# Patient Record
Sex: Male | Born: 1985
Health system: Southern US, Community
[De-identification: ages and names within clinical notes are randomized; demographics above are authoritative.]

## PROBLEM LIST (undated history)

## (undated) DIAGNOSIS — R4689 Other symptoms and signs involving appearance and behavior: Secondary | ICD-10-CM

## (undated) DIAGNOSIS — F913 Oppositional defiant disorder: Secondary | ICD-10-CM

## (undated) DIAGNOSIS — F32A Depression, unspecified: Secondary | ICD-10-CM

## (undated) DIAGNOSIS — K449 Diaphragmatic hernia without obstruction or gangrene: Secondary | ICD-10-CM

## (undated) DIAGNOSIS — F909 Attention-deficit hyperactivity disorder, unspecified type: Secondary | ICD-10-CM

## (undated) DIAGNOSIS — F329 Major depressive disorder, single episode, unspecified: Secondary | ICD-10-CM

## (undated) DIAGNOSIS — A630 Anogenital (venereal) warts: Secondary | ICD-10-CM

## (undated) DIAGNOSIS — F419 Anxiety disorder, unspecified: Secondary | ICD-10-CM

## (undated) HISTORY — DX: Other symptoms and signs involving appearance and behavior: R46.89

## (undated) HISTORY — DX: Major depressive disorder, single episode, unspecified: F32.9

## (undated) HISTORY — DX: Attention-deficit hyperactivity disorder, unspecified type: F90.9

## (undated) HISTORY — DX: Diaphragmatic hernia without obstruction or gangrene: K44.9

## (undated) HISTORY — DX: Oppositional defiant disorder: F91.3

## (undated) HISTORY — DX: Anogenital (venereal) warts: A63.0

## (undated) HISTORY — DX: Depression, unspecified: F32.A

## (undated) HISTORY — DX: Anxiety disorder, unspecified: F41.9

---

## 2004-01-26 ENCOUNTER — Emergency Department (HOSPITAL_COMMUNITY): Admission: EM | Admit: 2004-01-26 | Discharge: 2004-01-27 | Payer: Self-pay | Admitting: *Deleted

## 2006-05-18 ENCOUNTER — Emergency Department: Payer: Self-pay | Admitting: Emergency Medicine

## 2007-07-17 IMAGING — CR DG CHEST 2V
1 series · 2 of 2 positions shown · non-contrast
Comparison: none

REASON FOR EXAM: Pain. MC2
COMMENTS:

[Series 1: view not recorded · 0.17mm/px · 2 of 2 slices shown]
[im 1/2]
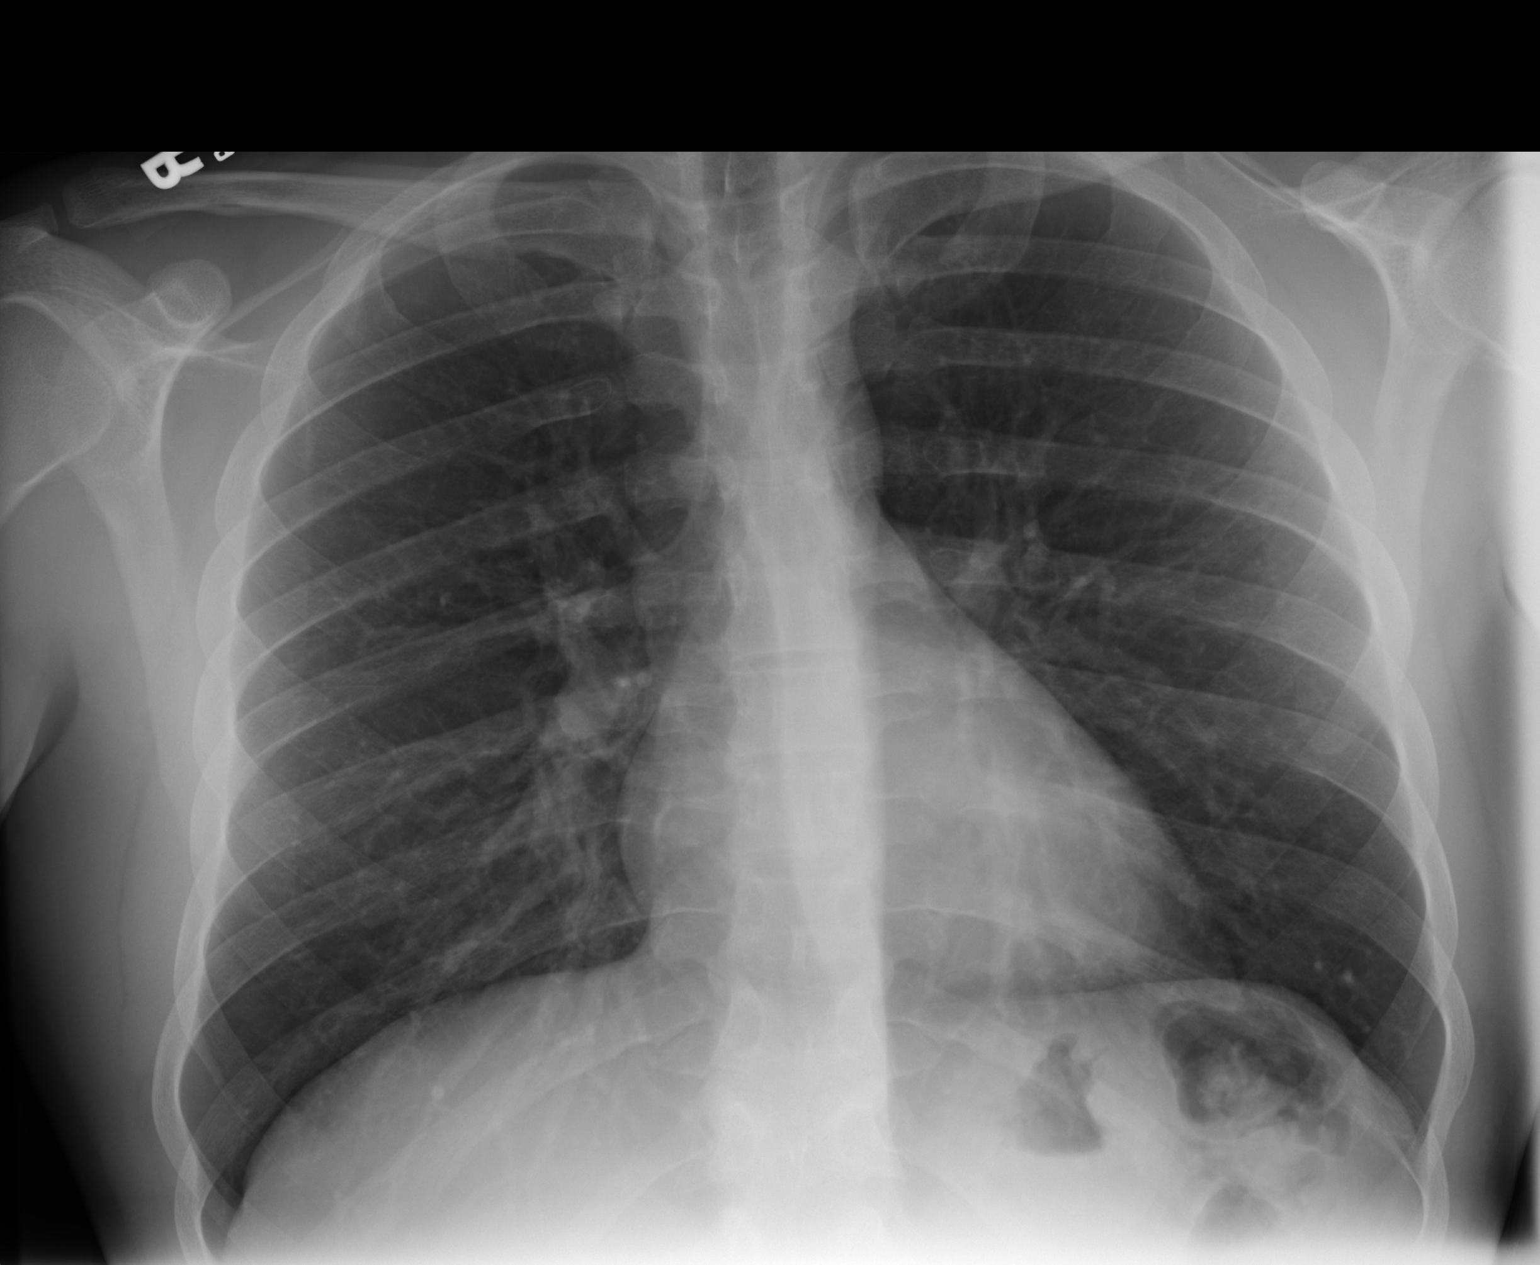
[im 2/2]
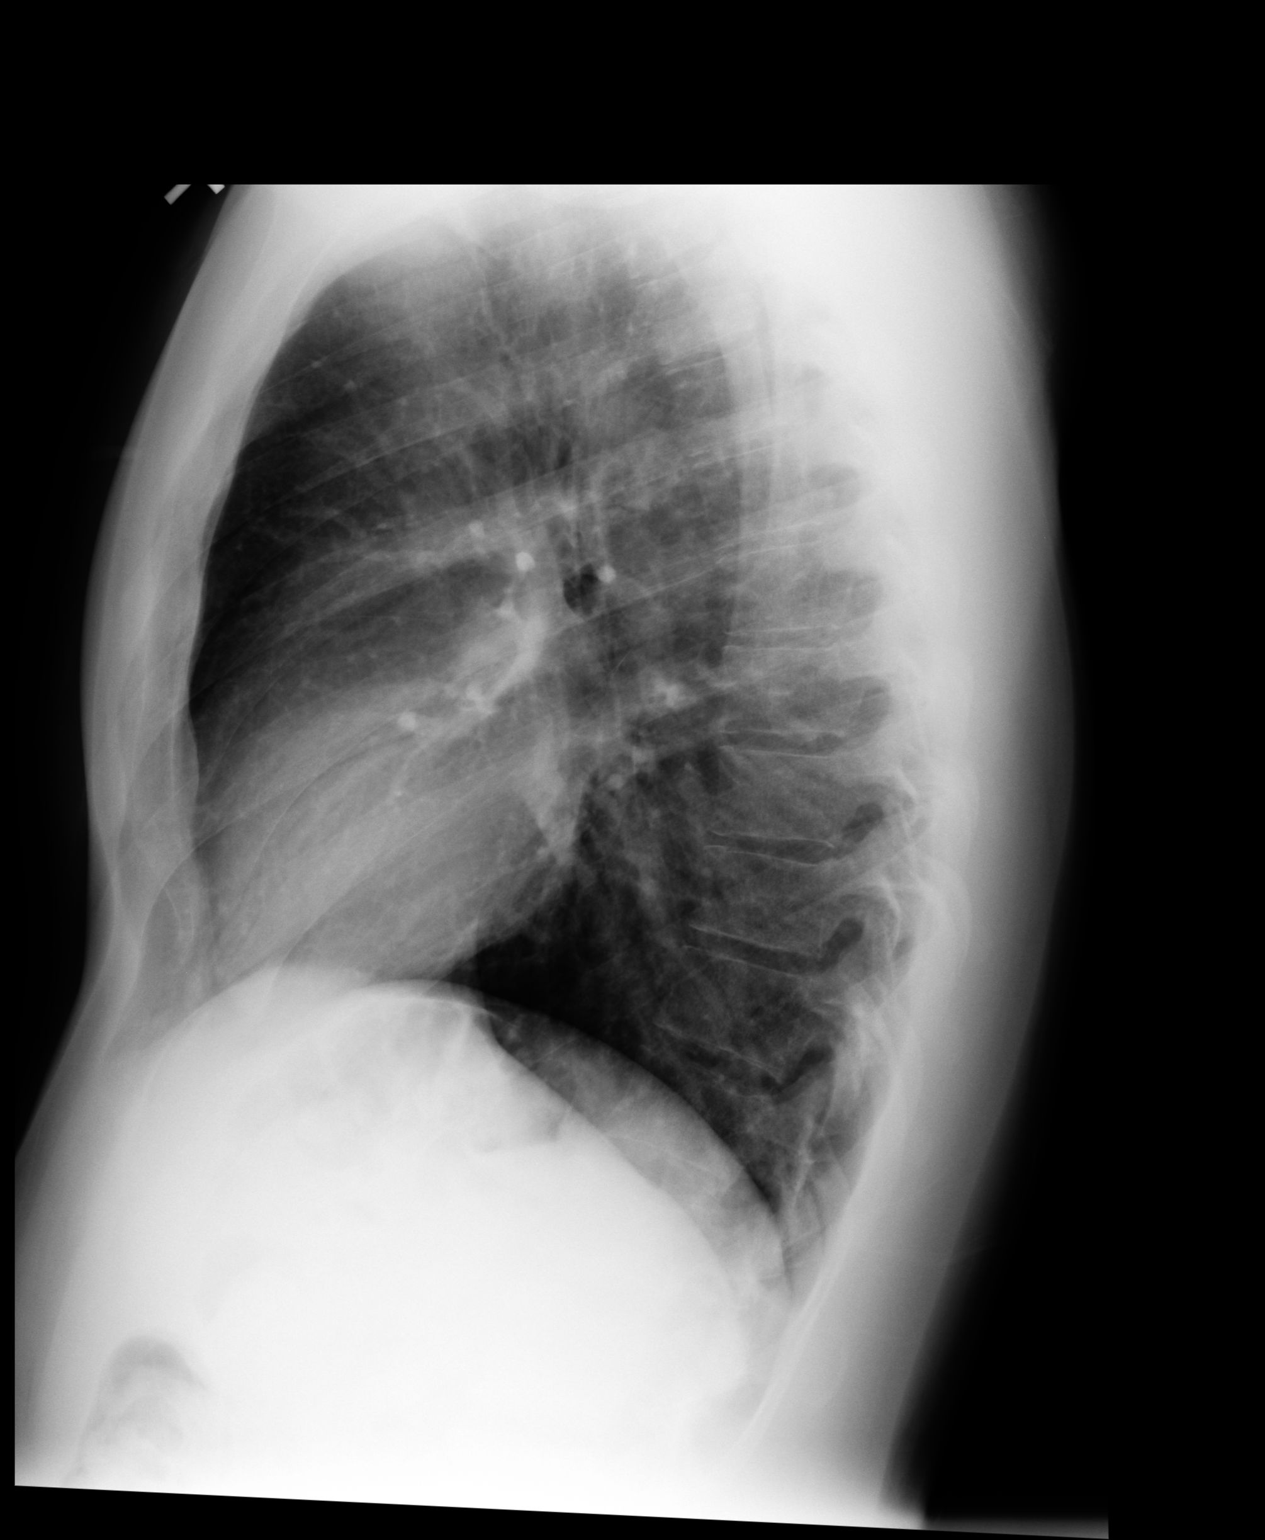

[2 of 2 positions shown; findings below may reference images not displayed]

PROCEDURE:     DXR - DXR CHEST PA (OR AP) AND LATERAL  - May 18, 2006  [DATE]

RESULT:     The lung fields are clear.  No pneumonia, pneumothorax, or
pleural effusion is seen.  The heart, mediastinal and osseous structures
show no significant abnormalities. The chest is hyperexpanded consistent
with a history of reactive airway disease.
IMPRESSION: The lung fields are clear.

The chest is hyperexpanded.

## 2009-05-02 HISTORY — PX: BRAIN SURGERY: SHX531

## 2009-09-27 ENCOUNTER — Emergency Department: Payer: Self-pay | Admitting: Emergency Medicine

## 2013-06-01 ENCOUNTER — Emergency Department: Payer: Self-pay | Admitting: Emergency Medicine

## 2013-06-06 ENCOUNTER — Emergency Department: Payer: Self-pay | Admitting: Emergency Medicine

## 2015-06-30 DIAGNOSIS — F909 Attention-deficit hyperactivity disorder, unspecified type: Secondary | ICD-10-CM | POA: Insufficient documentation

## 2015-06-30 DIAGNOSIS — Z8659 Personal history of other mental and behavioral disorders: Secondary | ICD-10-CM | POA: Insufficient documentation

## 2015-06-30 DIAGNOSIS — F32A Depression, unspecified: Secondary | ICD-10-CM | POA: Insufficient documentation

## 2015-06-30 DIAGNOSIS — R4689 Other symptoms and signs involving appearance and behavior: Secondary | ICD-10-CM | POA: Insufficient documentation

## 2015-06-30 DIAGNOSIS — F913 Oppositional defiant disorder: Secondary | ICD-10-CM

## 2015-06-30 DIAGNOSIS — F4321 Adjustment disorder with depressed mood: Secondary | ICD-10-CM | POA: Insufficient documentation

## 2015-06-30 DIAGNOSIS — A630 Anogenital (venereal) warts: Secondary | ICD-10-CM | POA: Insufficient documentation

## 2015-06-30 DIAGNOSIS — F329 Major depressive disorder, single episode, unspecified: Secondary | ICD-10-CM | POA: Insufficient documentation

## 2015-07-06 ENCOUNTER — Encounter: Payer: Self-pay | Admitting: Family Medicine

## 2015-07-06 ENCOUNTER — Ambulatory Visit (INDEPENDENT_AMBULATORY_CARE_PROVIDER_SITE_OTHER): Payer: 59 | Admitting: Family Medicine

## 2015-07-06 VITALS — BP 121/76 | HR 80 | Temp 98.7°F | Ht 71.25 in | Wt 180.0 lb

## 2015-07-06 DIAGNOSIS — D485 Neoplasm of uncertain behavior of skin: Secondary | ICD-10-CM | POA: Diagnosis not present

## 2015-07-06 DIAGNOSIS — R4689 Other symptoms and signs involving appearance and behavior: Secondary | ICD-10-CM

## 2015-07-06 DIAGNOSIS — Z720 Tobacco use: Secondary | ICD-10-CM | POA: Diagnosis not present

## 2015-07-06 DIAGNOSIS — N509 Disorder of male genital organs, unspecified: Secondary | ICD-10-CM | POA: Diagnosis not present

## 2015-07-06 DIAGNOSIS — Z0000 Encounter for general adult medical examination without abnormal findings: Secondary | ICD-10-CM | POA: Diagnosis not present

## 2015-07-06 DIAGNOSIS — F901 Attention-deficit hyperactivity disorder, predominantly hyperactive type: Secondary | ICD-10-CM

## 2015-07-06 DIAGNOSIS — F329 Major depressive disorder, single episode, unspecified: Secondary | ICD-10-CM | POA: Diagnosis not present

## 2015-07-06 DIAGNOSIS — B360 Pityriasis versicolor: Secondary | ICD-10-CM | POA: Diagnosis not present

## 2015-07-06 DIAGNOSIS — F913 Oppositional defiant disorder: Secondary | ICD-10-CM | POA: Diagnosis not present

## 2015-07-06 DIAGNOSIS — F32A Depression, unspecified: Secondary | ICD-10-CM

## 2015-07-06 NOTE — Patient Instructions (Addendum)
Use a selenium-containing shampoo twice a week; lather up, out of shower, dry for 15-20 minutes, then thoroughly rinse off; do that for a month We'll refer you to the dermatologist and psychiatrist Do self-testicular exams every month and let us know right away of any lumps or bumps Try tylenol 500 mg every six hours for a few days; if you continue to have discomfort in your genitals, then let me know and we'll refer you to a urologist We'll check labs today and contact you with the results If you have not heard anything from my staff in a week about any orders/referrals/studies from today, please contact us here to follow-up [phone] I'll really encourage you to cut down on your recreational use of marijuana, especially if it's causing you symptoms Don't ever drive if you feel like you might pass out or become weak or dizzy  Check your moles once a month and notify your (new) skin doctor if you have any that get larger or change color, especially if they turn darker or bleed I do encourage you to quit smoking Call [phone] to sign up for smoking cessation classes You can call 1-800-QUIT-NOW to talk with a smoking cessation coach   Health Maintenance, Male A healthy lifestyle and preventative care can promote health and wellness.  Maintain regular health, dental, and eye exams.  Eat a healthy diet. Foods like vegetables, fruits, whole grains, low-fat dairy products, and lean protein foods contain the nutrients you need and are low in calories. Decrease your intake of foods high in solid fats, added sugars, and salt. Get information about a proper diet from your health care provider, if necessary.  Regular physical exercise is one of the most important things you can do for your health. Most adults should get at least 150 minutes of moderate-intensity exercise (any activity that increases your heart rate and causes you to sweat) each week. In addition, most adults need  muscle-strengthening exercises on 2 or more days a week.   Maintain a healthy weight. The body mass index (BMI) is a screening tool to identify possible weight problems. It provides an estimate of body fat based on height and weight. Your health care provider can find your BMI and can help you achieve or maintain a healthy weight. For males 20 years and older:  A BMI below 18.5 is considered underweight.  A BMI of 18.5 to 24.9 is normal.  A BMI of 25 to 29.9 is considered overweight.  A BMI of 30 and above is considered obese.  Maintain normal blood lipids and cholesterol by exercising and minimizing your intake of saturated fat. Eat a balanced diet with plenty of fruits and vegetables. Blood tests for lipids and cholesterol should begin at age 10 and be repeated every 5 years. If your lipid or cholesterol levels are high, you are over age 48, or you are at high risk for heart disease, you may need your cholesterol levels checked more frequently.Ongoing high lipid and cholesterol levels should be treated with medicines if diet and exercise are not working.  If you smoke, find out from your health care provider how to quit. If you do not use tobacco, do not start.  Lung cancer screening is recommended for adults aged 15-80 years who are at high risk for developing lung cancer because of a history of smoking. A yearly low-dose CT scan of the lungs is recommended for people who have at least a 30-pack-year history of smoking and are current smokers  or have quit within the past 15 years. A pack year of smoking is smoking an average of 1 pack of cigarettes a day for 1 year (for example, a 30-pack-year history of smoking could mean smoking 1 pack a day for 30 years or 2 packs a day for 15 years). Yearly screening should continue until the smoker has stopped smoking for at least 15 years. Yearly screening should be stopped for people who develop a health problem that would prevent them from having lung  cancer treatment.  If you choose to drink alcohol, do not have more than 2 drinks per day. One drink is considered to be 12 oz (360 mL) of beer, 5 oz (150 mL) of wine, or 1.5 oz (45 mL) of liquor.  Avoid the use of street drugs. Do not share needles with anyone. Ask for help if you need support or instructions about stopping the use of drugs.  High blood pressure causes heart disease and increases the risk of stroke. High blood pressure is more likely to develop in:  People who have blood pressure in the end of the normal range (100-139/85-89 mm Hg).  People who are overweight or obese.  People who are African American.  If you are 45-5 years of age, have your blood pressure checked every 3-5 years. If you are 42 years of age or older, have your blood pressure checked every year. You should have your blood pressure measured twice--once when you are at a hospital or clinic, and once when you are not at a hospital or clinic. Record the average of the two measurements. To check your blood pressure when you are not at a hospital or clinic, you can use:  An automated blood pressure machine at a pharmacy.  A home blood pressure monitor.  If you are 49-25 years old, ask your health care provider if you should take aspirin to prevent heart disease.  Diabetes screening involves taking a blood sample to check your fasting blood sugar level. This should be done once every 3 years after age 91 if you are at a normal weight and without risk factors for diabetes. Testing should be considered at a younger age or be carried out more frequently if you are overweight and have at least 1 risk factor for diabetes.  Colorectal cancer can be detected and often prevented. Most routine colorectal cancer screening begins at the age of 82 and continues through age 19. However, your health care provider may recommend screening at an earlier age if you have risk factors for colon cancer. On a yearly basis, your health  care provider may provide home test kits to check for hidden blood in the stool. A small camera at the end of a tube may be used to directly examine the colon (sigmoidoscopy or colonoscopy) to detect the earliest forms of colorectal cancer. Talk to your health care provider about this at age 52 when routine screening begins. A direct exam of the colon should be repeated every 5-10 years through age 23, unless early forms of precancerous polyps or small growths are found.  People who are at an increased risk for hepatitis B should be screened for this virus. You are considered at high risk for hepatitis B if:  You were born in a country where hepatitis B occurs often. Talk with your health care provider about which countries are considered high risk.  Your parents were born in a high-risk country and you have not received a shot to protect  against hepatitis B (hepatitis B vaccine).  You have HIV or AIDS.  You use needles to inject street drugs.  You live with, or have sex with, someone who has hepatitis B.  You are a man who has sex with other men (MSM).  You get hemodialysis treatment.  You take certain medicines for conditions like cancer, organ transplantation, and autoimmune conditions.  Hepatitis C blood testing is recommended for all people born from [birth year] through 1967 and any individual with known risk factors for hepatitis C.  Healthy men should no longer receive prostate-specific antigen (PSA) blood tests as part of routine cancer screening. Talk to your health care provider about prostate cancer screening.  Testicular cancer screening is not recommended for adolescents or adult males who have no symptoms. Screening includes self-exam, a health care provider exam, and other screening tests. Consult with your health care provider about any symptoms you have or any concerns you have about testicular cancer.  Practice safe sex. Use condoms and avoid high-risk sexual practices to reduce  the spread of sexually transmitted infections (STIs).  You should be screened for STIs, including gonorrhea and chlamydia if:  You are sexually active and are younger than 24 years.  You are older than 24 years, and your health care provider tells you that you are at risk for this type of infection.  Your sexual activity has changed since you were last screened, and you are at an increased risk for chlamydia or gonorrhea. Ask your health care provider if you are at risk.  If you are at risk of being infected with HIV, it is recommended that you take a prescription medicine daily to prevent HIV infection. This is called pre-exposure prophylaxis (PrEP). You are considered at risk if:  You are a man who has sex with other men (MSM).  You are a heterosexual man who is sexually active with multiple partners.  You take drugs by injection.  You are sexually active with a partner who has HIV.  Talk with your health care provider about whether you are at high risk of being infected with HIV. If you choose to begin PrEP, you should first be tested for HIV. You should then be tested every 3 months for as long as you are taking PrEP.  Use sunscreen. Apply sunscreen liberally and repeatedly throughout the day. You should seek shade when your shadow is shorter than you. Protect yourself by wearing long sleeves, pants, a wide-brimmed hat, and sunglasses year round whenever you are outdoors.  Tell your health care provider of new moles or changes in moles, especially if there is a change in shape or color. Also, tell your health care provider if a mole is larger than the size of a pencil eraser.  A one-time screening for abdominal aortic aneurysm (AAA) and surgical repair of large AAAs by ultrasound is recommended for men aged 44-75 years who are current or former smokers.  Stay current with your vaccines (immunizations).   This information is not intended to replace advice given to you by your health  care provider. Make sure you discuss any questions you have with your health care provider.   Document Released: 10/15/2007 Document Revised: 05/09/2014 Document Reviewed: 09/13/2010 Elsevier Interactive Patient Education [employer].

## 2015-07-06 NOTE — Assessment & Plan Note (Signed)
Refer to psych 

## 2015-07-06 NOTE — Progress Notes (Signed)
Patient ID: Thomas Sawyer, male   DOB: 30-Dec-1985, 30 y.o.   MRN: [id number]  Subjective:   Thomas Sawyer is a 30 y.o. male here for a complete physical exam  Interim issues since last visit: gets fever blisters, abreva; has ADHD; gets anxious; he was seeing someone at [facility] but their session did not go well; he has had some spells; gets occasionally gets blurred vision, tunnel vision, has to sit down, puts his head down; when he gets really high, that triggers the spells  USPSTF grade A and B recommendations Alcohol: less than 14 drinks a week, limits on his own Depression:  Depression screen [facility] 2/9 07/06/2015  Decreased Interest 0  Down, Depressed, Hopeless 0  PHQ - 2 Score 0   Hypertension: controlled Obesity: controlled Tobacco use: smokes 7 cigs a day; has patches at the house; enjoys smoking; job stress is a smoking trigger HIV, hep B, hep C: just done 1-2 months ago STD testing and prevention (chl/gon/syphilis): tested for STDs, positive for chlamydia, treated just in the last 1-2 months Lipids: check labs Glucose: check labs Colorectal cancer: no first degree relatives Breast cancer: maternal grandmother had breast cancer; no lumps Lung cancer: n/a Diet: eats meat; limits fast food, tries to eat healthier Exercise: active at work, trying to do more; buddy is a Physiological scientist Skin cancer: dark moles on the stomach; fungus on the skin;    Past Medical History  Diagnosis Date  . Genital warts   . Depression   . ADHD (attention deficit hyperactivity disorder)   . Oppositional defiant behavior    Past Surgical History  Procedure Laterality Date  . Brain surgery  2011   Family History  Problem Relation Age of Onset  . Asthma Father   . Diabetes Father   . Cancer Neg Hx   . COPD Neg Hx   . Heart disease Neg Hx   . Hypertension Neg Hx   . Stroke Neg Hx    Social History  Substance Use Topics  . Smoking status: Current Every Day Smoker -- 0.50 packs/day for 18 years     Types: Cigarettes  . Smokeless tobacco: Current User  . Alcohol Use: Yes     Comment: rare   Review of Systems  Constitutional: Negative for unexpected weight change.  Genitourinary: Negative for dysuria, urgency, discharge, scrotal swelling (but a little discomfort; no lumps) and testicular pain.  Psychiatric/Behavioral: Positive for decreased concentration (adhd). The patient is hyperactive.     Objective:   Filed Vitals:   07/06/15 1455  BP: 121/76  Pulse: 80  Temp: 98.7 F (37.1 C)  Height: 5' 11.25" (1.81 m)  Weight: 180 lb (81.647 kg)  SpO2: 98%   Body mass index is 24.92 kg/(m^2). Wt Readings from Last 3 Encounters:  07/06/15 180 lb (81.647 kg)  05/06/13 175 lb (79.379 kg)   Physical Exam  Constitutional: He appears well-developed and well-nourished. No distress.  HENT:  Head: Normocephalic and atraumatic.  Nose: Nose normal.  Mouth/Throat: Oropharynx is clear and moist.  Eyes: EOM are normal. No scleral icterus.  Neck: No JVD present. No thyromegaly present.  Cardiovascular: Normal rate, regular rhythm and normal heart sounds.   Pulmonary/Chest: Effort normal and breath sounds normal. No respiratory distress. He has no wheezes. He has no rales.  Abdominal: Soft. Bowel sounds are normal. He exhibits no distension. There is no tenderness. There is no guarding.  Genitourinary: Right testis shows no mass, no swelling and no tenderness.  Right testis is descended. Left testis shows no mass, no swelling and no tenderness. Left testis is descended.  Musculoskeletal: Normal range of motion. He exhibits no edema.  Lymphadenopathy:    He has no cervical adenopathy.  Neurological: He is alert. He displays normal reflexes. He exhibits normal muscle tone. Coordination normal.  Skin: Skin is warm and dry. Lesion (darkly pigmented macular nevus, irregular on abdomen) noted. No rash (faint nummular rash on trunk, very fine scale) noted. He is not diaphoretic. No erythema. No  pallor.  Psychiatric: He has a normal mood and affect. His behavior is normal. Judgment and thought content normal.  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    Refer to dermatologist      Relevant Orders   Ambulatory referral to Dermatology     Other   Depression    Refer to psychiatry      ADHD (attention deficit hyperactivity disorder)    Refer to psychiatrist; consider nonstimulant options for anxiety and ADHD      Relevant Orders   Ambulatory referral to Psychiatry   Oppositional defiant behavior    Refer to psych      Relevant Orders   Ambulatory referral to Psychiatry   Preventative health care - Primary    USPSTF grade A and B recommendations reviewed with patient; age-appropriate recommendations, preventive care, screening tests, etc discussed and encouraged; healthy living encouraged; see AVS for patient education given to patient      Relevant Orders   CBC with Differential/Platelet (Completed)   Comprehensive metabolic panel (Completed)   Lipid Panel w/o Chol/HDL Ratio (Completed)   TSH (Completed)   Tobacco abuse    Encouraged cessation       Other Visit Diagnoses    Tinea versicolor        explained dx; use of selenium-containing shampoo, lather, let dry, rinse thoroughly 2x a week x 1 month    Tender scrotum        advised monthly self-TSE; try tylenol for one week; if not resolving, then to urologist; no mass on exam       Meds ordered this encounter  Medications  . Cholecalciferol (VITAMIN D PO)    Sig: Take by mouth daily.  Marland Kitchen BIOTIN PO    Sig: Take by mouth daily.   Orders Placed This Encounter  Procedures  . CBC with Differential/Platelet  . Comprehensive metabolic panel    Order Specific Question:  Has the patient fasted?    Answer:  Yes  . Lipid Panel w/o Chol/HDL Ratio    Order Specific  Question:  Has the patient fasted?    Answer:  Yes  . TSH  . Ambulatory referral to Psychiatry    Referral Priority:  Routine    Referral Type:  Psychiatric    Referral Reason:  Specialty Services Required    Requested Specialty:  Psychiatry    Number of Visits Requested:  1  . Ambulatory referral to Dermatology    Referral Priority:  Routine    Referral Type:  Consultation    Referral Reason:  Specialty Services Required    Requested Specialty:  Dermatology    Number of Visits Requested:  1    Follow up plan: No Follow-up on file.  An after-visit summary was printed and given to the patient at [place].  Please see the patient instructions which may contain other information and recommendations beyond what is mentioned above in the assessment and plan.  Orders Placed This Encounter  Procedures  . CBC with Differential/Platelet  . Comprehensive metabolic panel  . Lipid Panel w/o Chol/HDL Ratio  . TSH  . Ambulatory referral to Psychiatry  . Ambulatory referral to Dermatology   Meds ordered this encounter  Medications  . Cholecalciferol (VITAMIN D PO)    Sig: Take by mouth daily.  Marland Kitchen BIOTIN PO    Sig: Take by mouth daily.

## 2015-07-06 NOTE — Assessment & Plan Note (Signed)
Refer to psychiatrist; consider nonstimulant options for anxiety and ADHD

## 2015-07-07 LAB — COMPREHENSIVE METABOLIC PANEL
ALK PHOS: 54 IU/L (ref 39–117)
ALT: 27 IU/L (ref 0–44)
AST: 18 IU/L (ref 0–40)
Albumin/Globulin Ratio: 2.2 (ref 1.1–2.5)
Albumin: 4.7 g/dL (ref 3.5–5.5)
BUN/Creatinine Ratio: 17 (ref 8–19)
BUN: 18 mg/dL (ref 6–20)
Bilirubin Total: 0.3 mg/dL (ref 0.0–1.2)
CO2: 26 mmol/L (ref 18–29)
CREATININE: 1.09 mg/dL (ref 0.76–1.27)
Calcium: 9.5 mg/dL (ref 8.7–10.2)
Chloride: 98 mmol/L (ref 96–106)
GFR calc Af Amer: 104 mL/min/{1.73_m2} (ref >59)
GFR calc non Af Amer: 90 mL/min/{1.73_m2} (ref >59)
GLOBULIN, TOTAL: 2.1 g/dL (ref 1.5–4.5)
GLUCOSE: 85 mg/dL (ref 65–99)
Potassium: 5.5 mmol/L — ABNORMAL HIGH (ref 3.5–5.2)
SODIUM: 141 mmol/L (ref 134–144)
Total Protein: 6.8 g/dL (ref 6.0–8.5)

## 2015-07-07 LAB — TSH: TSH: 1.37 u[IU]/mL (ref 0.450–4.500)

## 2015-07-07 LAB — CBC WITH DIFFERENTIAL/PLATELET
BASOS ABS: 0.1 10*3/uL (ref 0.0–0.2)
Basos: 1 %
EOS (ABSOLUTE): 0.3 10*3/uL (ref 0.0–0.4)
Eos: 4 %
HEMATOCRIT: 43.8 % (ref 37.5–51.0)
Hemoglobin: 14.4 g/dL (ref 12.6–17.7)
Immature Grans (Abs): 0 10*3/uL (ref 0.0–0.1)
Immature Granulocytes: 0 %
LYMPHS ABS: 2.3 10*3/uL (ref 0.7–3.1)
Lymphs: 32 %
MCH: 32.3 pg (ref 26.6–33.0)
MCHC: 32.9 g/dL (ref 31.5–35.7)
MCV: 98 fL — ABNORMAL HIGH (ref 79–97)
MONOS ABS: 0.6 10*3/uL (ref 0.1–0.9)
Monocytes: 8 %
Neutrophils Absolute: 3.9 10*3/uL (ref 1.4–7.0)
Neutrophils: 55 %
Platelets: 305 10*3/uL (ref 150–379)
RBC: 4.46 x10E6/uL (ref 4.14–5.80)
RDW: 13.4 % (ref 12.3–15.4)
WBC: 7.1 10*3/uL (ref 3.4–10.8)

## 2015-07-07 LAB — LIPID PANEL W/O CHOL/HDL RATIO
CHOLESTEROL TOTAL: 214 mg/dL — AB (ref 100–199)
HDL: 50 mg/dL (ref >39)
LDL CALC: 132 mg/dL — AB (ref 0–99)
TRIGLYCERIDES: 160 mg/dL — AB (ref 0–149)
VLDL Cholesterol Cal: 32 mg/dL (ref 5–40)

## 2015-07-13 ENCOUNTER — Telehealth: Payer: Self-pay | Admitting: Family Medicine

## 2015-07-13 DIAGNOSIS — E875 Hyperkalemia: Secondary | ICD-10-CM | POA: Insufficient documentation

## 2015-07-13 NOTE — Telephone Encounter (Signed)
Patient notified

## 2015-07-13 NOTE — Telephone Encounter (Signed)
Left message to call.

## 2015-07-13 NOTE — Telephone Encounter (Signed)
Please let pt know that his potassium was a little high; sometimes that just happens with blood draws if the red blood cells break open inside the tube; sometimes it's from too much potassium in the diet or salt substitutes or kidney problems; please let's just recheck it in the next 2-3 days; come by for lab appt; non-fasting Glucose normal; kidney function and liver function tests are fine His bad cholesterol is just a few points above normal, so try to limit bacon and sausage and cheese and other fatty foods Thank you

## 2015-07-17 ENCOUNTER — Telehealth: Payer: Self-pay | Admitting: Family Medicine

## 2015-07-17 DIAGNOSIS — N5082 Scrotal pain: Secondary | ICD-10-CM

## 2015-07-17 DIAGNOSIS — Z0000 Encounter for general adult medical examination without abnormal findings: Secondary | ICD-10-CM | POA: Insufficient documentation

## 2015-07-17 DIAGNOSIS — F17210 Nicotine dependence, cigarettes, uncomplicated: Secondary | ICD-10-CM | POA: Insufficient documentation

## 2015-07-17 DIAGNOSIS — Z720 Tobacco use: Secondary | ICD-10-CM | POA: Insufficient documentation

## 2015-07-17 NOTE — Assessment & Plan Note (Signed)
Refer to psychiatry. 

## 2015-07-17 NOTE — Assessment & Plan Note (Signed)
USPSTF grade A and B recommendations reviewed with patient; age-appropriate recommendations, preventive care, screening tests, etc discussed and encouraged; healthy living encouraged; see AVS for patient education given to patient  

## 2015-07-17 NOTE — Telephone Encounter (Signed)
Please call patient See if his tenderness in his genital area has completely resolved If he is still having any symptoms at all, I'd like to have him see a urologist Thank you

## 2015-07-17 NOTE — Assessment & Plan Note (Signed)
Encouraged cessation.

## 2015-07-17 NOTE — Telephone Encounter (Signed)
He is still having some pain, he said it's not as bad, but he would like to go to see Urology. Can you enter the referral? He prefers Friday afternoons if possible.

## 2015-07-17 NOTE — Assessment & Plan Note (Signed)
Refer to dermatologist 

## 2015-07-17 NOTE — Telephone Encounter (Signed)
Referral entered; thank you 

## 2015-07-28 ENCOUNTER — Telehealth: Payer: Self-pay | Admitting: Family Medicine

## 2015-07-28 NOTE — Telephone Encounter (Signed)
Left message to call.

## 2015-07-28 NOTE — Telephone Encounter (Signed)
Please ask patient to have the potassium recheck please; ASAP

## 2015-07-29 NOTE — Telephone Encounter (Signed)
I spoke with patient, he states he works til 5:30pm. He can come in Friday afternoon.

## 2015-07-31 ENCOUNTER — Other Ambulatory Visit: Payer: 59

## 2015-07-31 DIAGNOSIS — E875 Hyperkalemia: Secondary | ICD-10-CM

## 2015-08-01 LAB — POTASSIUM: POTASSIUM: 4.2 mmol/L (ref 3.5–5.2)

## 2015-08-07 ENCOUNTER — Ambulatory Visit (INDEPENDENT_AMBULATORY_CARE_PROVIDER_SITE_OTHER): Payer: 59 | Admitting: Urology

## 2015-08-07 ENCOUNTER — Encounter: Payer: Self-pay | Admitting: Urology

## 2015-08-07 VITALS — BP 117/72 | HR 73 | Temp 99.0°F | Resp 16 | Ht 73.0 in | Wt 180.0 lb

## 2015-08-07 DIAGNOSIS — N50819 Testicular pain, unspecified: Secondary | ICD-10-CM

## 2015-08-07 NOTE — Progress Notes (Signed)
08/07/2015 3:58 PM   [name] Apr 02, 1986 [id number]  Referring provider: Arnetha Courser, MD [address] [place], [place] [address]  Chief Complaint  Patient presents with  . Establish Care  . Testicle Pain    HPI: The patient is a 30 year old gentleman presents for evaluation of his right testicular pain. He notes that for the last 3-4 months that he gets an occasional sharp pain in his right testicle. He usually only lasts a few minutes and dissipate spontaneously. He finds it bothersome only during this occasion. His biggest concern is that there is something seriously wrong, so is presenting today for further evaluation. He denies any history of testicular infections or orchitis.   PMH: Past Medical History  Diagnosis Date  . Genital warts   . Depression   . ADHD (attention deficit hyperactivity disorder)   . Oppositional defiant behavior   . Anxiety   . Depression     Surgical History: Past Surgical History  Procedure Laterality Date  . Brain surgery  2011    Home Medications:    Medication List       This list is accurate as of: 08/07/15  3:58 PM.  Always use your most recent med list.               BIOTIN PO  Take by mouth daily.     VITAMIN D PO  Take by mouth daily.        Allergies: No Known Allergies  Family History: Family History  Problem Relation Age of Onset  . Asthma Father   . Diabetes Father   . Cancer Neg Hx   . COPD Neg Hx   . Heart disease Neg Hx   . Hypertension Neg Hx   . Stroke Neg Hx   . Prostate cancer Neg Hx   . Sickle cell trait Neg Hx     Social History:  reports that he has been smoking Cigarettes.  He has a 9 pack-year smoking history. He uses smokeless tobacco. He reports that he drinks alcohol. He reports that he uses illicit drugs (Marijuana).  ROS: UROLOGY Frequent Urination?: No Hard to postpone urination?: No Burning/pain with urination?: No Get up at night to urinate?: No Leakage of  urine?: No Urine stream starts and stops?: No Trouble starting stream?: No Do you have to strain to urinate?: No Blood in urine?: No Urinary tract infection?: No Sexually transmitted disease?: No Injury to kidneys or bladder?: No Painful intercourse?: No Weak stream?: No Erection problems?: No Penile pain?: No                                      Physical Exam: BP 117/72 mmHg  Pulse 73  Temp(Src) 99 F (37.2 C)  Resp 16  Ht 6\' 1"  (1.854 m)  Wt 180 lb (81.647 kg)  BMI 23.75 kg/m2  Constitutional:  Alert and oriented, No acute distress. HEENT: [place] AT, moist mucus membranes.  Trachea midline, no masses. Cardiovascular: No clubbing, cyanosis, or edema. Respiratory: Normal respiratory effort, no increased work of breathing. GI: Abdomen is soft, nontender, nondistended, no abdominal masses GU: No CVA tenderness. Normal circumcised phallus. Testicles descended bilaterally. Nontender to palpation. No masses. Skin: No rashes, bruises or suspicious lesions. Lymph: No cervical or inguinal adenopathy. Neurologic: Grossly intact, no focal deficits, moving all 4 extremities. Psychiatric: Normal mood and affect.  Laboratory Data: Lab  Results  Component Value Date   WBC 7.1 07/06/2015   HCT 43.8 07/06/2015   MCV 98* 07/06/2015   PLT 305 07/06/2015    Lab Results  Component Value Date   CREATININE 1.09 07/06/2015    No results found for: PSA  No results found for: TESTOSTERONE  No results found for: HGBA1C  Urinalysis No results found for: COLORURINE, APPEARANCEUR, LABSPEC, PHURINE, GLUCOSEU, HGBUR, BILIRUBINUR, KETONESUR, PROTEINUR, UROBILINOGEN, NITRITE, LEUKOCYTESUR   Assessment & Plan:    1. Right testicular pain-intermittent The patient is intermittent testicle pain that is sharp in nature. His exam is normal without sign of testicular masses. He is nontender palpation at this time. I discussed the patient that pain is not typically a sign of  testicular cancer. The patient's pain does return, I have suggested take Advil 600 mg 3 times a day for a week to help with inflammation. He will follow-up with [place] if his pain becomes worse or more frequent. We will otherwise see him on an as-needed basis.  Return if symptoms worsen or fail to improve.  Nickie Retort, MD  [facility] [address], [place], [place] [address] [phone]

## 2017-11-08 ENCOUNTER — Encounter: Payer: Self-pay | Admitting: Family Medicine

## 2017-11-08 ENCOUNTER — Other Ambulatory Visit: Payer: Self-pay

## 2017-11-08 ENCOUNTER — Ambulatory Visit: Payer: 59 | Admitting: Family Medicine

## 2017-11-08 VITALS — BP 113/74 | HR 72 | Temp 98.7°F | Ht 73.0 in | Wt 191.3 lb

## 2017-11-08 DIAGNOSIS — F1911 Other psychoactive substance abuse, in remission: Secondary | ICD-10-CM

## 2017-11-08 DIAGNOSIS — B360 Pityriasis versicolor: Secondary | ICD-10-CM | POA: Diagnosis not present

## 2017-11-08 DIAGNOSIS — Z87898 Personal history of other specified conditions: Secondary | ICD-10-CM | POA: Diagnosis not present

## 2017-11-08 DIAGNOSIS — G4700 Insomnia, unspecified: Secondary | ICD-10-CM

## 2017-11-08 DIAGNOSIS — F5101 Primary insomnia: Secondary | ICD-10-CM | POA: Insufficient documentation

## 2017-11-08 MED ORDER — FLUCONAZOLE 150 MG PO TABS: ORAL_TABLET | ORAL | 0 refills | Status: DC

## 2017-11-08 MED ORDER — TRAZODONE HCL 50 MG PO TABS: 25.0000 mg | ORAL_TABLET | Freq: Every evening | ORAL | 3 refills | Status: DC | PRN

## 2017-11-08 MED ORDER — SELENIUM SULFIDE 1 % EX LOTN: 1.0000 "application " | TOPICAL_LOTION | Freq: Every day | CUTANEOUS | 1 refills | Status: DC

## 2017-11-08 NOTE — Progress Notes (Signed)
BP 113/74   Pulse 72   Temp 98.7 F (37.1 C) (Oral)   Ht 6\' 1"  (1.854 m)   Wt 191 lb 5 oz (86.8 kg)   SpO2 97%   BMI 25.24 kg/m    Subjective:    Patient ID: Thomas Sawyer, male    DOB: 12-Jan-1986, 32 y.o.   MRN: [id number]  HPI: Thomas Sawyer is a 32 y.o. male  Chief Complaint  Patient presents with  . Skin issues    pt states sking fungus all over for about a year   Pt here today with a rash that has been intermittent for several years. Went to health dept in 2016 and was told it was a tinea rash and given selenium sulfide, which seemed to clear it temporarily. Rash becomes worse in the summer, and is now across most of his body. No itching, flaking, burning. Has not been trying anything OTC.   6 months or so of difficulty sleeping, works very early in the morning and doesn't go to bed until 10:30 at night. Tried melatonin, zquil, and other natural remedies with no relief.   Just celebrated 1 year without drugs and alcohol. Has been going to church twice weekly, which he states is much more effective for him than AA or NA meetings which he's tried in the past.   Past Medical History:  Diagnosis Date  . ADHD (attention deficit hyperactivity disorder)   . Anxiety   . Depression   . Depression   . Genital warts   . Oppositional defiant behavior    Social History   Socioeconomic History  . Marital status: Single    Spouse name: Not on file  . Number of children: Not on file  . Years of education: Not on file  . Highest education level: Not on file  Occupational History  . Not on file  Social Needs  . Financial resource strain: Not on file  . Food insecurity:    Worry: Not on file    Inability: Not on file  . Transportation needs:    Medical: Not on file    Non-medical: Not on file  Tobacco Use  . Smoking status: Current Every Day Smoker    Packs/day: 0.50    Years: 18.00    Pack years: 9.00    Types: Cigarettes  . Smokeless tobacco: Current User  Substance  and Sexual Activity  . Alcohol use: Yes    Comment: rare  . Drug use: Yes    Types: Marijuana  . Sexual activity: Not on file  Lifestyle  . Physical activity:    Days per week: Not on file    Minutes per session: Not on file  . Stress: Not on file  Relationships  . Social connections:    Talks on phone: Not on file    Gets together: Not on file    Attends religious service: Not on file    Active member of club or organization: Not on file    Attends meetings of clubs or organizations: Not on file    Relationship status: Not on file  . Intimate partner violence:    Fear of current or ex partner: Not on file    Emotionally abused: Not on file    Physically abused: Not on file    Forced sexual activity: Not on file  Other Topics Concern  . Not on file  Social History Narrative  . Not on file   Relevant past  medical, surgical, family and social history reviewed and updated as indicated. Interim medical history since our last visit reviewed. Allergies and medications reviewed and updated.  Review of Systems  Per HPI unless specifically indicated above     Objective:    BP 113/74   Pulse 72   Temp 98.7 F (37.1 C) (Oral)   Ht 6\' 1"  (1.854 m)   Wt 191 lb 5 oz (86.8 kg)   SpO2 97%   BMI 25.24 kg/m   Wt Readings from Last 3 Encounters:  11/08/17 191 lb 5 oz (86.8 kg)  08/07/15 180 lb (81.6 kg)  07/06/15 180 lb (81.6 kg)    Physical Exam  Constitutional: He is oriented to person, place, and time. He appears well-developed and well-nourished. No distress.  HENT:  Head: Atraumatic.  Eyes: Pupils are equal, round, and reactive to light. Conjunctivae are normal.  Neck: Normal range of motion. Neck supple.  Cardiovascular: Normal rate and regular rhythm.  Pulmonary/Chest: Effort normal and breath sounds normal.  Musculoskeletal: Normal range of motion.  Neurological: He is alert and oriented to person, place, and time.  Skin: Skin is warm and dry.  Hypopigmented,  patchy macular rash across most of body, worse on trunk  Psychiatric: He has a normal mood and affect. His behavior is normal.  Nursing note and vitals reviewed.   Results for orders placed or performed in visit on 07/31/15  Potassium  Result Value Ref Range   Potassium 4.2 3.5 - 5.2 mmol/L      Assessment & Plan:   Problem List Items Addressed This Visit      Other   Insomnia    Pt willing to try trazodone QHS prn for sleep. Discussed good sleep hygiene additionally. F/u in 1 month for recheck. Risks and benefits reviewed      Hx of substance abuse    1 year sober, congratulated his success and encouraged continued church attendance. Discussed individual counseling since group counseling wasn't a good fit for him, he will think about it.        Other Visit Diagnoses    Tinea versicolor    -  Primary   Pt requesting oral medication given diffuse extent, will tx with fluconazole PO and selenium sulfide lotion prn. F/u if no improvement       Follow up plan: Return in about 6 weeks (around 12/20/2017) for CPE.

## 2017-11-09 ENCOUNTER — Telehealth: Payer: Self-pay

## 2017-11-09 NOTE — Telephone Encounter (Signed)
Medication problem. Fax from [facility].  The Selsun lotion is 2.5%, not 1%.  The Selson shampoo is 1%..   Pharmacy wants to know which one is the correct medication.

## 2017-11-13 DIAGNOSIS — F1911 Other psychoactive substance abuse, in remission: Secondary | ICD-10-CM | POA: Insufficient documentation

## 2017-11-13 NOTE — Telephone Encounter (Signed)
I'd like the lotion, so can you call and have them fill the 2.5% lotion?

## 2017-11-13 NOTE — Patient Instructions (Signed)
Follow-up in 6 weeks

## 2017-11-13 NOTE — Assessment & Plan Note (Signed)
Pt willing to try trazodone QHS prn for sleep. Discussed good sleep hygiene additionally. F/u in 1 month for recheck. Risks and benefits reviewed

## 2017-11-13 NOTE — Telephone Encounter (Signed)
Pharmacy notified.

## 2017-11-13 NOTE — Assessment & Plan Note (Signed)
1 year sober, congratulated his success and encouraged continued church attendance. Discussed individual counseling since group counseling wasn't a good fit for him, he will think about it.

## 2017-12-22 ENCOUNTER — Other Ambulatory Visit: Payer: Self-pay

## 2017-12-22 ENCOUNTER — Encounter: Payer: Self-pay | Admitting: Family Medicine

## 2017-12-22 ENCOUNTER — Ambulatory Visit (INDEPENDENT_AMBULATORY_CARE_PROVIDER_SITE_OTHER): Payer: 59 | Admitting: Family Medicine

## 2017-12-22 VITALS — BP 118/77 | HR 65 | Temp 97.8°F | Ht 71.0 in | Wt 189.6 lb

## 2017-12-22 DIAGNOSIS — B360 Pityriasis versicolor: Secondary | ICD-10-CM

## 2017-12-22 DIAGNOSIS — Z113 Encounter for screening for infections with a predominantly sexual mode of transmission: Secondary | ICD-10-CM | POA: Diagnosis not present

## 2017-12-22 DIAGNOSIS — Z23 Encounter for immunization: Secondary | ICD-10-CM

## 2017-12-22 DIAGNOSIS — J069 Acute upper respiratory infection, unspecified: Secondary | ICD-10-CM | POA: Diagnosis not present

## 2017-12-22 DIAGNOSIS — Z0000 Encounter for general adult medical examination without abnormal findings: Secondary | ICD-10-CM

## 2017-12-22 DIAGNOSIS — G4700 Insomnia, unspecified: Secondary | ICD-10-CM

## 2017-12-22 LAB — UA/M W/RFLX CULTURE, ROUTINE
Bilirubin, UA: NEGATIVE
GLUCOSE, UA: NEGATIVE
Ketones, UA: NEGATIVE
Leukocytes, UA: NEGATIVE
Nitrite, UA: NEGATIVE
RBC, UA: NEGATIVE
SPEC GRAV UA: 1.025 (ref 1.005–1.030)
Urobilinogen, Ur: 1 mg/dL (ref 0.2–1.0)
pH, UA: 6.5 (ref 5.0–7.5)

## 2017-12-22 LAB — MICROSCOPIC EXAMINATION
Bacteria, UA: NONE SEEN
RBC, UA: NONE SEEN /hpf (ref 0–2)

## 2017-12-22 NOTE — Progress Notes (Signed)
BP 118/77   Pulse 65   Temp 97.8 F (36.6 C) (Oral)   Ht 5\' 11"  (1.803 m)   Wt 189 lb 9.6 oz (86 kg)   SpO2 98%   BMI 26.44 kg/m    Subjective:    Patient ID: Thomas Sawyer, male    DOB: 01-24-1986, 32 y.o.   MRN: [id number]  HPI: Thomas Sawyer is a 32 y.o. male presenting on 12/22/2017 for comprehensive medical examination. Current medical complaints include:see below  Rash has cleared up for the most part, was never able to get the selsun from the pharmacy so hasn't been doing anything for it.   Started the trazodone, works very well for him on the weekends but on weekdays he only gets about 3-4 hours of sleep so notes he is unable to take it and still wake up ready for work on weekdays. Does not want to try a new medicine, just feels he needs to adjust his routine to accommodate for longer sleep hours.   Sore throat, congestion, facial pain and pressure for about a week. Taking dayquil, nyquil. Feels he's gotten some better. Several sick contacts.   Interim Problems from his last visit: yes  Depression Screen done today and results listed below:  Depression screen [facility] 2/9 07/06/2015  Decreased Interest 0  Down, Depressed, Hopeless 0  PHQ - 2 Score 0    The patient does not have a history of falls. I did not complete a risk assessment for falls. A plan of care for falls was not documented.   Past Medical History:  Past Medical History:  Diagnosis Date  . ADHD (attention deficit hyperactivity disorder)   . Anxiety   . Depression   . Depression   . Genital warts   . Oppositional defiant behavior     Surgical History:  Past Surgical History:  Procedure Laterality Date  . BRAIN SURGERY  2011    Medications:  Current Outpatient Medications on File Prior to Visit  Medication Sig  . traZODone (DESYREL) 50 MG tablet Take 0.5-1 tablets (25-50 mg total) by mouth at bedtime as needed for sleep.   No current facility-administered medications on file prior to visit.      Allergies:  No Known Allergies  Social History:  Social History   Socioeconomic History  . Marital status: Single    Spouse name: Not on file  . Number of children: Not on file  . Years of education: Not on file  . Highest education level: Not on file  Occupational History  . Not on file  Social Needs  . Financial resource strain: Not on file  . Food insecurity:    Worry: Not on file    Inability: Not on file  . Transportation needs:    Medical: Not on file    Non-medical: Not on file  Tobacco Use  . Smoking status: Current Every Day Smoker    Packs/day: 0.50    Years: 18.00    Pack years: 9.00    Types: Cigarettes  . Smokeless tobacco: Current User  Substance and Sexual Activity  . Alcohol use: Not Currently    Comment: rare  . Drug use: Yes    Types: Marijuana  . Sexual activity: Not on file  Lifestyle  . Physical activity:    Days per week: Not on file    Minutes per session: Not on file  . Stress: Not on file  Relationships  . Social connections:  Talks on phone: Not on file    Gets together: Not on file    Attends religious service: Not on file    Active member of club or organization: Not on file    Attends meetings of clubs or organizations: Not on file    Relationship status: Not on file  . Intimate partner violence:    Fear of current or ex partner: Not on file    Emotionally abused: Not on file    Physically abused: Not on file    Forced sexual activity: Not on file  Other Topics Concern  . Not on file  Social History Narrative  . Not on file   Social History   Tobacco Use  Smoking Status Current Every Day Smoker  . Packs/day: 0.50  . Years: 18.00  . Pack years: 9.00  . Types: Cigarettes  Smokeless Tobacco Current User   Social History   Substance and Sexual Activity  Alcohol Use Not Currently   Comment: rare    Family History:  Family History  Problem Relation Age of Onset  . Asthma Father   . Diabetes Father   .  Cancer Neg Hx   . COPD Neg Hx   . Heart disease Neg Hx   . Hypertension Neg Hx   . Stroke Neg Hx   . Prostate cancer Neg Hx   . Sickle cell trait Neg Hx     Past medical history, surgical history, medications, allergies, family history and social history reviewed with patient today and changes made to appropriate areas of the chart.   Review of Systems - General ROS: negative Psychological ROS: positive for - sleep disturbances Ophthalmic ROS: negative ENT ROS: negative Allergy and Immunology ROS: negative Hematological and Lymphatic ROS: negative Respiratory ROS: no cough, shortness of breath, or wheezing Cardiovascular ROS: no chest pain or dyspnea on exertion Gastrointestinal ROS: no abdominal pain, change in bowel habits, or black or bloody stools Genito-Urinary ROS: no dysuria, trouble voiding, or hematuria Musculoskeletal ROS: negative Neurological ROS: no TIA or stroke symptoms Dermatological ROS: positive for rash All other ROS negative except what is listed above and in the HPI.      Objective:    BP 118/77   Pulse 65   Temp 97.8 F (36.6 C) (Oral)   Ht 5\' 11"  (1.803 m)   Wt 189 lb 9.6 oz (86 kg)   SpO2 98%   BMI 26.44 kg/m   Wt Readings from Last 3 Encounters:  12/22/17 189 lb 9.6 oz (86 kg)  11/08/17 191 lb 5 oz (86.8 kg)  08/07/15 180 lb (81.6 kg)    Physical Exam  Constitutional: He is oriented to person, place, and time. He appears well-developed and well-nourished. No distress.  HENT:  Head: Atraumatic.  Right Ear: External ear normal.  Left Ear: External ear normal.  Oropharynx erythematous  Eyes: Pupils are equal, round, and reactive to light. Conjunctivae are normal. No scleral icterus.  Neck: Normal range of motion. Neck supple.  Cardiovascular: Normal rate, regular rhythm, normal heart sounds and intact distal pulses.  No murmur heard. Pulmonary/Chest: Effort normal and breath sounds normal. No respiratory distress.  Abdominal: Soft. Bowel  sounds are normal. He exhibits no distension and no mass. There is no tenderness. There is no guarding.  Musculoskeletal: Normal range of motion. He exhibits no edema or tenderness.  Neurological: He is alert and oriented to person, place, and time. He has normal reflexes.  Skin: Skin is warm and dry. Rash (  hypopigmented irregular macular rash on trunk sporadically) noted.  Psychiatric: He has a normal mood and affect. His behavior is normal.  Nursing note and vitals reviewed.   Results for orders placed or performed in visit on 12/22/17  Microscopic Examination  Result Value Ref Range   WBC, UA 0-5 0 - 5 /hpf   RBC, UA None seen 0 - 2 /hpf   Epithelial Cells (non renal) 0-10 0 - 10 /hpf   Bacteria, UA None seen None seen/Few  Comprehensive metabolic panel  Result Value Ref Range   Glucose 60 (L) 65 - 99 mg/dL   BUN 12 6 - 20 mg/dL   Creatinine, Ser 1.07 0.76 - 1.27 mg/dL   GFR calc non Af Amer 91 >59 mL/min/1.73   GFR calc Af Amer 105 >59 mL/min/1.73   BUN/Creatinine Ratio 11 9 - 20   Sodium 143 134 - 144 mmol/L   Potassium 4.0 3.5 - 5.2 mmol/L   Chloride 101 96 - 106 mmol/L   CO2 23 20 - 29 mmol/L   Calcium 9.3 8.7 - 10.2 mg/dL   Total Protein 6.8 6.0 - 8.5 g/dL   Albumin 4.8 3.5 - 5.5 g/dL   Globulin, Total 2.0 1.5 - 4.5 g/dL   Albumin/Globulin Ratio 2.4 (H) 1.2 - 2.2   Bilirubin Total 0.4 0.0 - 1.2 mg/dL   Alkaline Phosphatase 53 39 - 117 IU/L   AST 22 0 - 40 IU/L   ALT 24 0 - 44 IU/L  Lipid Panel w/o Chol/HDL Ratio  Result Value Ref Range   Cholesterol, Total 186 100 - 199 mg/dL   Triglycerides 98 0 - 149 mg/dL   HDL 42 >39 mg/dL   VLDL Cholesterol Cal 20 5 - 40 mg/dL   LDL Calculated 124 (H) 0 - 99 mg/dL  UA/M w/rflx Culture, Routine  Result Value Ref Range   Specific Gravity, UA 1.025 1.005 - 1.030   pH, UA 6.5 5.0 - 7.5   Color, UA Orange Yellow   Appearance Ur Hazy (A) Clear   Leukocytes, UA Negative Negative   Protein, UA 1+ (A) Negative/Trace   Glucose,  UA Negative Negative   Ketones, UA Negative Negative   RBC, UA Negative Negative   Bilirubin, UA Negative Negative   Urobilinogen, Ur 1.0 0.2 - 1.0 mg/dL   Nitrite, UA Negative Negative   Microscopic Examination See below:   HIV antibody  Result Value Ref Range   HIV Screen 4th Generation wRfx Non Reactive Non Reactive  RPR  Result Value Ref Range   RPR Ser Ql Non Reactive Non Reactive  HSV(herpes simplex vrs) 1+2 ab-IgG  Result Value Ref Range   HSV 1 Glycoprotein G Ab, IgG 38.50 (H) 0.00 - 0.90 index   HSV 2 IgG, Type Spec <0.91 0.00 - 0.90 index  CBC with Differential/Platelet  Result Value Ref Range   WBC 5.5 3.4 - 10.8 x10E3/uL   RBC 4.28 4.14 - 5.80 x10E6/uL   Hemoglobin 14.6 13.0 - 17.7 g/dL   Hematocrit 40.4 37.5 - 51.0 %   MCV 94 79 - 97 fL   MCH 34.1 (H) 26.6 - 33.0 pg   MCHC 36.1 (H) 31.5 - 35.7 g/dL   RDW 13.2 12.3 - 15.4 %   Platelets 285 150 - 450 x10E3/uL   Neutrophils 55 Not Estab. %   Lymphs 31 Not Estab. %   Monocytes 11 Not Estab. %   Eos 2 Not Estab. %   Basos 1 Not Estab. %  Neutrophils Absolute 3.0 1.4 - 7.0 x10E3/uL   Lymphocytes Absolute 1.7 0.7 - 3.1 x10E3/uL   Monocytes Absolute 0.6 0.1 - 0.9 x10E3/uL   EOS (ABSOLUTE) 0.1 0.0 - 0.4 x10E3/uL   Basophils Absolute 0.0 0.0 - 0.2 x10E3/uL   Immature Granulocytes 0 Not Estab. %   Immature Grans (Abs) 0.0 0.0 - 0.1 x10E3/uL      Assessment & Plan:   Problem List Items Addressed This Visit      Other   Insomnia    Continue trazodone prn. Work on adjusting sleep routine       Other Visit Diagnoses    Viral upper respiratory tract infection    -  Primary   Continue OTC remedies, no evidence of bacterial infection. F/u if worsening or no improvement   Annual physical exam       Relevant Orders   CBC with Differential/Platelet   Comprehensive metabolic panel (Completed)   Lipid Panel w/o Chol/HDL Ratio (Completed)   UA/M w/rflx Culture, Routine (Completed)   Need for  diphtheria-tetanus-pertussis (Tdap) vaccine       Relevant Orders   Tdap vaccine greater than or equal to 7yo IM (Completed)   Routine screening for STI (sexually transmitted infection)       Relevant Orders   HIV antibody (Completed)   RPR (Completed)   HSV(herpes simplex vrs) 1+2 ab-IgG (Completed)   GC/Chlamydia Probe Amp   Tinea versicolor       Get OTC selsun shampoo or clotrimazole cream       Discussed aspirin prophylaxis for myocardial infarction prevention and decision was it was not indicated  LABORATORY TESTING:  Health maintenance labs ordered today as discussed above.   The natural history of prostate cancer and ongoing controversy regarding screening and potential treatment outcomes of prostate cancer has been discussed with the patient. The meaning of a false positive PSA and a false negative PSA has been discussed. He indicates understanding of the limitations of this screening test and wishes not to proceed with screening PSA testing.   IMMUNIZATIONS:   - Tdap: Tetanus vaccination status reviewed: last tetanus booster within 10 years. - Influenza: Refused  PATIENT COUNSELING:    Sexuality: Discussed sexually transmitted diseases, partner selection, use of condoms, avoidance of unintended pregnancy  and contraceptive alternatives.   Advised to avoid cigarette smoking.  I discussed with the patient that most people either abstain from alcohol or drink within safe limits (<=14/week and <=4 drinks/occasion for males, <=7/weeks and <= 3 drinks/occasion for females) and that the risk for alcohol disorders and other health effects rises proportionally with the number of drinks per week and how often a drinker exceeds daily limits.  Discussed cessation/primary prevention of drug use and availability of treatment for abuse.   Diet: Encouraged to adjust caloric intake to maintain  or achieve ideal body weight, to reduce intake of dietary saturated fat and total fat, to  limit sodium intake by avoiding high sodium foods and not adding table salt, and to maintain adequate dietary potassium and calcium preferably from fresh fruits, vegetables, and low-fat dairy products.    stressed the importance of regular exercise  Injury prevention: Discussed safety belts, safety helmets, smoke detector, smoking near bedding or upholstery.   Dental health: Discussed importance of regular tooth brushing, flossing, and dental visits.   Follow up plan: NEXT PREVENTATIVE PHYSICAL DUE IN 1 YEAR. Return in about 1 year (around 12/23/2018) for CPE.

## 2017-12-22 NOTE — Patient Instructions (Addendum)
Clotrimazole cream or selsun blue shampoo or lotion    Tdap Vaccine (Tetanus, Diphtheria and Pertussis): What You Need to Know 1. Why get vaccinated? Tetanus, diphtheria and pertussis are very serious diseases. Tdap vaccine can protect [place] from these diseases. And, Tdap vaccine given to pregnant women can protect newborn babies against pertussis. TETANUS (Lockjaw) is rare in the [place] today. It causes painful muscle tightening and stiffness, usually all over the body.  It can lead to tightening of muscles in the head and neck so you can't open your mouth, swallow, or sometimes even breathe. Tetanus kills about 1 out of 10 people who are infected even after receiving the best medical care.  DIPHTHERIA is also rare in the [place] today. It can cause a thick coating to form in the back of the throat.  It can lead to breathing problems, heart failure, paralysis, and death.  PERTUSSIS (Whooping Cough) causes severe coughing spells, which can cause difficulty breathing, vomiting and disturbed sleep.  It can also lead to weight loss, incontinence, and rib fractures. Up to 2 in 100 adolescents and 5 in 100 adults with pertussis are hospitalized or have complications, which could include pneumonia or death.  These diseases are caused by bacteria. Diphtheria and pertussis are spread from person to person through secretions from coughing or sneezing. Tetanus enters the body through cuts, scratches, or wounds. Before vaccines, as many as 200,000 cases of diphtheria, 200,000 cases of pertussis, and hundreds of cases of tetanus, were reported in the [place] each year. Since vaccination began, reports of cases for tetanus and diphtheria have dropped by about 99% and for pertussis by about 80%. 2. Tdap vaccine Tdap vaccine can protect adolescents and adults from tetanus, diphtheria, and pertussis. One dose of Tdap is routinely given at age 23 or 40. People who did not get Tdap at that age  should get it as soon as possible. Tdap is especially important for healthcare professionals and anyone having close contact with a baby younger than 12 months. Pregnant women should get a dose of Tdap during every pregnancy, to protect the newborn from pertussis. Infants are most at risk for severe, life-threatening complications from pertussis. Another vaccine, called Td, protects against tetanus and diphtheria, but not pertussis. A Td booster should be given every 10 years. Tdap may be given as one of these boosters if you have never gotten Tdap before. Tdap may also be given after a severe cut or burn to prevent tetanus infection. Your doctor or the person giving you the vaccine can give you more information. Tdap may safely be given at the same time as other vaccines. 3. Some people should not get this vaccine  A person who has ever had a life-threatening allergic reaction after a previous dose of any diphtheria, tetanus or pertussis containing vaccine, OR has a severe allergy to any part of this vaccine, should not get Tdap vaccine. Tell the person giving the vaccine about any severe allergies.  Anyone who had coma or long repeated seizures within 7 days after a childhood dose of DTP or DTaP, or a previous dose of Tdap, should not get Tdap, unless a cause other than the vaccine was found. They can still get Td.  Talk to your doctor if you: ? have seizures or another nervous system problem, ? had severe pain or swelling after any vaccine containing diphtheria, tetanus or pertussis, ? ever had a condition called Guillain-Barr Syndrome (GBS), ? aren't feeling well on the  day the shot is scheduled. 4. Risks With any medicine, including vaccines, there is a chance of side effects. These are usually mild and go away on their own. Serious reactions are also possible but are rare. Most people who get Tdap vaccine do not have any problems with it. Mild problems following Tdap: (Did not interfere  with activities)  Pain where the shot was given (about 3 in 4 adolescents or 2 in 3 adults)  Redness or swelling where the shot was given (about 1 person in 5)  Mild fever of at least 100.19F (up to about 1 in 25 adolescents or 1 in 100 adults)  Headache (about 3 or 4 people in 10)  Tiredness (about 1 person in 3 or 4)  Nausea, vomiting, diarrhea, stomach ache (up to 1 in 4 adolescents or 1 in 10 adults)  Chills, sore joints (about 1 person in 10)  Body aches (about 1 person in 3 or 4)  Rash, swollen glands (uncommon)  Moderate problems following Tdap: (Interfered with activities, but did not require medical attention)  Pain where the shot was given (up to 1 in 5 or 6)  Redness or swelling where the shot was given (up to about 1 in 16 adolescents or 1 in 12 adults)  Fever over 102F (about 1 in 100 adolescents or 1 in 250 adults)  Headache (about 1 in 7 adolescents or 1 in 10 adults)  Nausea, vomiting, diarrhea, stomach ache (up to 1 or 3 people in 100)  Swelling of the entire arm where the shot was given (up to about 1 in 500).  Severe problems following Tdap: (Unable to perform usual activities; required medical attention)  Swelling, severe pain, bleeding and redness in the arm where the shot was given (rare).  Problems that could happen after any vaccine:  People sometimes faint after a medical procedure, including vaccination. Sitting or lying down for about 15 minutes can help prevent fainting, and injuries caused by a fall. Tell your doctor if you feel dizzy, or have vision changes or ringing in the ears.  Some people get severe pain in the shoulder and have difficulty moving the arm where a shot was given. This happens very rarely.  Any medication can cause a severe allergic reaction. Such reactions from a vaccine are very rare, estimated at fewer than 1 in a million doses, and would happen within a few minutes to a few hours after the vaccination. As with any  medicine, there is a very remote chance of a vaccine causing a serious injury or death. The safety of vaccines is always being monitored. For more information, visit: http://www.aguilar.org/ 5. What if there is a serious problem? What should I look for? Look for anything that concerns you, such as signs of a severe allergic reaction, very high fever, or unusual behavior. Signs of a severe allergic reaction can include hives, swelling of the face and throat, difficulty breathing, a fast heartbeat, dizziness, and weakness. These would usually start a few minutes to a few hours after the vaccination. What should I do?  If you think it is a severe allergic reaction or other emergency that can't wait, call 9-1-1 or get the person to the nearest hospital. Otherwise, call your doctor.  Afterward, the reaction should be reported to the Vaccine Adverse Event Reporting System (VAERS). Your doctor might file this report, or you can do it yourself through the VAERS web site at www.vaers.SamedayNews.es, or by calling 3107982676. ? VAERS does not give  medical advice. 6. The National Vaccine Injury Compensation Program The [employer] Vaccine Injury Compensation Program (VICP) is a federal program that was created to compensate people who may have been injured by certain vaccines. Persons who believe they may have been injured by a vaccine can learn about the program and about filing a claim by calling 605-359-7243 or visiting the [place] website at GoldCloset.com.ee. There is a time limit to file a claim for compensation. 7. How can I learn more?  Ask your doctor. He or she can give you the vaccine package insert or suggest other sources of information.  Call your local or state health department.  Contact the Centers for Disease Control and Prevention (CDC): ? Call 858-144-8054 (1-800-CDC-INFO) or ? Visit CDC's website at http://hunter.com/ CDC Tdap Vaccine VIS (06/25/13) This information  is not intended to replace advice given to you by your health care provider. Make sure you discuss any questions you have with your health care provider. Document Released: 10/18/2011 Document Revised: 01/07/2016 Document Reviewed: 01/07/2016 Elsevier Interactive Patient Education  2017 [employer]. Follow up in 1 year

## 2017-12-23 LAB — COMPREHENSIVE METABOLIC PANEL
ALBUMIN: 4.8 g/dL (ref 3.5–5.5)
ALK PHOS: 53 IU/L (ref 39–117)
ALT: 24 IU/L (ref 0–44)
AST: 22 IU/L (ref 0–40)
Albumin/Globulin Ratio: 2.4 — ABNORMAL HIGH (ref 1.2–2.2)
BILIRUBIN TOTAL: 0.4 mg/dL (ref 0.0–1.2)
BUN / CREAT RATIO: 11 (ref 9–20)
BUN: 12 mg/dL (ref 6–20)
CHLORIDE: 101 mmol/L (ref 96–106)
CO2: 23 mmol/L (ref 20–29)
Calcium: 9.3 mg/dL (ref 8.7–10.2)
Creatinine, Ser: 1.07 mg/dL (ref 0.76–1.27)
GFR calc Af Amer: 105 mL/min/{1.73_m2} (ref >59)
GFR calc non Af Amer: 91 mL/min/{1.73_m2} (ref >59)
GLUCOSE: 60 mg/dL — AB (ref 65–99)
Globulin, Total: 2 g/dL (ref 1.5–4.5)
Potassium: 4 mmol/L (ref 3.5–5.2)
SODIUM: 143 mmol/L (ref 134–144)
Total Protein: 6.8 g/dL (ref 6.0–8.5)

## 2017-12-23 LAB — CBC WITH DIFFERENTIAL/PLATELET
BASOS ABS: 0 10*3/uL (ref 0.0–0.2)
Basos: 1 %
EOS (ABSOLUTE): 0.1 10*3/uL (ref 0.0–0.4)
Eos: 2 %
Hematocrit: 40.4 % (ref 37.5–51.0)
Hemoglobin: 14.6 g/dL (ref 13.0–17.7)
IMMATURE GRANS (ABS): 0 10*3/uL (ref 0.0–0.1)
Immature Granulocytes: 0 %
LYMPHS: 31 %
Lymphocytes Absolute: 1.7 10*3/uL (ref 0.7–3.1)
MCH: 34.1 pg — ABNORMAL HIGH (ref 26.6–33.0)
MCHC: 36.1 g/dL — ABNORMAL HIGH (ref 31.5–35.7)
MCV: 94 fL (ref 79–97)
Monocytes Absolute: 0.6 10*3/uL (ref 0.1–0.9)
Monocytes: 11 %
Neutrophils Absolute: 3 10*3/uL (ref 1.4–7.0)
Neutrophils: 55 %
PLATELETS: 285 10*3/uL (ref 150–450)
RBC: 4.28 x10E6/uL (ref 4.14–5.80)
RDW: 13.2 % (ref 12.3–15.4)
WBC: 5.5 10*3/uL (ref 3.4–10.8)

## 2017-12-23 LAB — HSV(HERPES SIMPLEX VRS) I + II AB-IGG: HSV 1 GLYCOPROTEIN G AB, IGG: 38.5 {index} — AB (ref 0.00–0.90)

## 2017-12-23 LAB — LIPID PANEL W/O CHOL/HDL RATIO
CHOLESTEROL TOTAL: 186 mg/dL (ref 100–199)
HDL: 42 mg/dL (ref >39)
LDL Calculated: 124 mg/dL — ABNORMAL HIGH (ref 0–99)
TRIGLYCERIDES: 98 mg/dL (ref 0–149)
VLDL Cholesterol Cal: 20 mg/dL (ref 5–40)

## 2017-12-23 LAB — HIV ANTIBODY (ROUTINE TESTING W REFLEX): HIV Screen 4th Generation wRfx: NONREACTIVE

## 2017-12-23 LAB — RPR: RPR Ser Ql: NONREACTIVE

## 2017-12-25 NOTE — Assessment & Plan Note (Signed)
Continue trazodone prn. Work on adjusting sleep routine

## 2017-12-26 LAB — GC/CHLAMYDIA PROBE AMP
CHLAMYDIA, DNA PROBE: NEGATIVE
Neisseria gonorrhoeae by PCR: NEGATIVE

## 2017-12-27 ENCOUNTER — Telehealth: Payer: Self-pay | Admitting: Family Medicine

## 2017-12-27 DIAGNOSIS — B009 Herpesviral infection, unspecified: Secondary | ICD-10-CM

## 2017-12-27 MED ORDER — VALACYCLOVIR HCL 1 G PO TABS: 1000.0000 mg | ORAL_TABLET | Freq: Two times a day (BID) | ORAL | 3 refills | Status: DC | PRN

## 2017-12-27 NOTE — Telephone Encounter (Signed)
Sent in prn valtrex for flares

## 2018-12-28 ENCOUNTER — Other Ambulatory Visit: Payer: Self-pay

## 2018-12-28 ENCOUNTER — Ambulatory Visit (INDEPENDENT_AMBULATORY_CARE_PROVIDER_SITE_OTHER): Payer: BC Managed Care – PPO | Admitting: Family Medicine

## 2018-12-28 ENCOUNTER — Encounter: Payer: Self-pay | Admitting: Family Medicine

## 2018-12-28 VITALS — BP 129/85 | HR 68 | Temp 98.2°F | Ht 71.0 in | Wt 206.0 lb

## 2018-12-28 DIAGNOSIS — B009 Herpesviral infection, unspecified: Secondary | ICD-10-CM | POA: Diagnosis not present

## 2018-12-28 DIAGNOSIS — Z0000 Encounter for general adult medical examination without abnormal findings: Secondary | ICD-10-CM

## 2018-12-28 DIAGNOSIS — B360 Pityriasis versicolor: Secondary | ICD-10-CM

## 2018-12-28 DIAGNOSIS — Z23 Encounter for immunization: Secondary | ICD-10-CM

## 2018-12-28 DIAGNOSIS — N50811 Right testicular pain: Secondary | ICD-10-CM

## 2018-12-28 LAB — UA/M W/RFLX CULTURE, ROUTINE
Bilirubin, UA: NEGATIVE
Glucose, UA: NEGATIVE
Ketones, UA: NEGATIVE
Leukocytes,UA: NEGATIVE
Nitrite, UA: NEGATIVE
Protein,UA: NEGATIVE
RBC, UA: NEGATIVE
Specific Gravity, UA: >1.03 — ABNORMAL HIGH (ref 1.005–1.030)
Urobilinogen, Ur: 0.2 mg/dL (ref 0.2–1.0)
pH, UA: 5.5 (ref 5.0–7.5)

## 2018-12-28 MED ORDER — KETOCONAZOLE 2 % EX CREA: 1.0000 "application " | TOPICAL_CREAM | Freq: Every day | CUTANEOUS | 1 refills | Status: DC

## 2018-12-28 MED ORDER — VALACYCLOVIR HCL 1 G PO TABS: 1000.0000 mg | ORAL_TABLET | Freq: Two times a day (BID) | ORAL | 3 refills | Status: DC | PRN

## 2018-12-28 NOTE — Progress Notes (Signed)
BP 129/85   Pulse 68   Temp 98.2 F (36.8 C) (Oral)   Ht 5\' 11"  (1.803 m)   Wt 206 lb (93.4 kg)   SpO2 98%   BMI 28.73 kg/m    Subjective:    Patient ID: Thomas Sawyer, male    DOB: 06-24-1985, 33 y.o.   MRN: [id number]  HPI: Thomas Sawyer is a 33 y.o. male presenting on 12/28/2018 for comprehensive medical examination. Current medical complaints include:see below  Sharp right testicular pain intermittently x 8-9 months, not necessarily tied to any particular movement. No scrotal swelling, redness, heat, testicular lumps, direct injury, dysuria, hematuria, flank pain, abdominal pain. Not trying anything OTC for sxs. Has had pain similar to this in the past and was treated for epididymitis.   Taking valtrex prn for cold sores. Seems to work well when he uses it.   Tinea rash seems to keep coming back when summer comes, seems to get worse when he gets hot. Has been using selsun blue shampoo which helped some but does not fully take the rash away.   He currently lives with: Interim Problems from his last visit: no  Depression Screen done today and results listed below:  Depression screen [facility] 2/9 12/28/2018 07/06/2015  Decreased Interest 0 0  Down, Depressed, Hopeless 0 0  PHQ - 2 Score 0 0  Altered sleeping 1 -  Tired, decreased energy 1 -  Change in appetite 0 -  Feeling bad or failure about yourself  0 -  Trouble concentrating 1 -  Moving slowly or fidgety/restless 0 -  Suicidal thoughts 0 -  PHQ-9 Score 3 -    The patient does not have a history of falls. I did not complete a risk assessment for falls. A plan of care for falls was not documented.   Past Medical History:  Past Medical History:  Diagnosis Date  . ADHD (attention deficit hyperactivity disorder)   . Anxiety   . Depression   . Depression   . Genital warts   . Oppositional defiant behavior     Surgical History:  Past Surgical History:  Procedure Laterality Date  . BRAIN SURGERY  2011     Medications:  No current outpatient medications on file prior to visit.   No current facility-administered medications on file prior to visit.     Allergies:  No Known Allergies  Social History:  Social History   Socioeconomic History  . Marital status: Single    Spouse name: Not on file  . Number of children: Not on file  . Years of education: Not on file  . Highest education level: Not on file  Occupational History  . Not on file  Social Needs  . Financial resource strain: Not on file  . Food insecurity    Worry: Not on file    Inability: Not on file  . Transportation needs    Medical: Not on file    Non-medical: Not on file  Tobacco Use  . Smoking status: Former Smoker    Packs/day: 0.50    Years: 18.00    Pack years: 9.00    Types: Cigarettes  . Smokeless tobacco: Current User  Substance and Sexual Activity  . Alcohol use: Not Currently    Comment: rare  . Drug use: Not Currently    Types: Marijuana  . Sexual activity: Not on file  Lifestyle  . Physical activity    Days per week: Not on file  Minutes per session: Not on file  . Stress: Not on file  Relationships  . Social Herbalist on phone: Not on file    Gets together: Not on file    Attends religious service: Not on file    Active member of club or organization: Not on file    Attends meetings of clubs or organizations: Not on file    Relationship status: Not on file  . Intimate partner violence    Fear of current or ex partner: Not on file    Emotionally abused: Not on file    Physically abused: Not on file    Forced sexual activity: Not on file  Other Topics Concern  . Not on file  Social History Narrative  . Not on file   Social History   Tobacco Use  Smoking Status Former Smoker  . Packs/day: 0.50  . Years: 18.00  . Pack years: 9.00  . Types: Cigarettes  Smokeless Tobacco Current User   Social History   Substance and Sexual Activity  Alcohol Use Not Currently    Comment: rare    Family History:  Family History  Problem Relation Age of Onset  . Asthma Father   . Diabetes Father   . Cancer Neg Hx   . COPD Neg Hx   . Heart disease Neg Hx   . Hypertension Neg Hx   . Stroke Neg Hx   . Prostate cancer Neg Hx   . Sickle cell trait Neg Hx     Past medical history, surgical history, medications, allergies, family history and social history reviewed with patient today and changes made to appropriate areas of the chart.   Review of Systems - General ROS: negative Psychological ROS: negative Ophthalmic ROS: negative ENT ROS: negative Allergy and Immunology ROS: negative Hematological and Lymphatic ROS: negative Endocrine ROS: negative Respiratory ROS: no cough, shortness of breath, or wheezing Cardiovascular ROS: no chest pain or dyspnea on exertion Gastrointestinal ROS: no abdominal pain, change in bowel habits, or black or bloody stools Genito-Urinary ROS: positive for - scrotal mass/pain Musculoskeletal ROS: negative Neurological ROS: no TIA or stroke symptoms Dermatological ROS: positive for rash All other ROS negative except what is listed above and in the HPI.      Objective:    BP 129/85   Pulse 68   Temp 98.2 F (36.8 C) (Oral)   Ht 5\' 11"  (1.803 m)   Wt 206 lb (93.4 kg)   SpO2 98%   BMI 28.73 kg/m   Wt Readings from Last 3 Encounters:  12/28/18 206 lb (93.4 kg)  12/22/17 189 lb 9.6 oz (86 kg)  11/08/17 191 lb 5 oz (86.8 kg)    Physical Exam Vitals signs and nursing note reviewed.  Constitutional:      General: He is not in acute distress.    Appearance: He is well-developed.  HENT:     Head: Atraumatic.     Right Ear: Tympanic membrane and external ear normal.     Left Ear: Tympanic membrane and external ear normal.     Nose: Nose normal.     Mouth/Throat:     Mouth: Mucous membranes are moist.     Pharynx: Oropharynx is clear.  Eyes:     General: No scleral icterus.    Conjunctiva/sclera: Conjunctivae normal.      Pupils: Pupils are equal, round, and reactive to light.  Neck:     Musculoskeletal: Normal range of motion and neck supple.  Cardiovascular:     Rate and Rhythm: Normal rate and regular rhythm.     Heart sounds: Normal heart sounds. No murmur.  Pulmonary:     Effort: Pulmonary effort is normal. No respiratory distress.     Breath sounds: Normal breath sounds.  Abdominal:     General: Bowel sounds are normal. There is no distension.     Palpations: Abdomen is soft. There is no mass.     Tenderness: There is no abdominal tenderness. There is no guarding.  Genitourinary:    Penis: Normal.      Comments: No testicular masses palpated b/l, though right epididymitis feels enlarged and ttp No scrotal swelling, redness, rashes noted Musculoskeletal: Normal range of motion.        General: No tenderness.  Skin:    General: Skin is warm and dry.     Findings: No rash.  Neurological:     General: No focal deficit present.     Mental Status: He is alert and oriented to person, place, and time.     Deep Tendon Reflexes: Reflexes are normal and symmetric.  Psychiatric:        Mood and Affect: Mood normal.        Behavior: Behavior normal.        Thought Content: Thought content normal.        Judgment: Judgment normal.     Results for orders placed or performed in visit on 12/28/18  CBC with Differential/Platelet  Result Value Ref Range   WBC 5.8 3.4 - 10.8 x10E3/uL   RBC 4.34 4.14 - 5.80 x10E6/uL   Hemoglobin 14.2 13.0 - 17.7 g/dL   Hematocrit 40.1 37.5 - 51.0 %   MCV 92 79 - 97 fL   MCH 32.7 26.6 - 33.0 pg   MCHC 35.4 31.5 - 35.7 g/dL   RDW 12.3 11.6 - 15.4 %   Platelets 310 150 - 450 x10E3/uL   Neutrophils 52 Not Estab. %   Lymphs 35 Not Estab. %   Monocytes 9 Not Estab. %   Eos 2 Not Estab. %   Basos 1 Not Estab. %   Neutrophils Absolute 3.0 1.4 - 7.0 x10E3/uL   Lymphocytes Absolute 2.0 0.7 - 3.1 x10E3/uL   Monocytes Absolute 0.5 0.1 - 0.9 x10E3/uL   EOS (ABSOLUTE) 0.1  0.0 - 0.4 x10E3/uL   Basophils Absolute 0.0 0.0 - 0.2 x10E3/uL   Immature Granulocytes 1 Not Estab. %   Immature Grans (Abs) 0.0 0.0 - 0.1 x10E3/uL  Comprehensive metabolic panel  Result Value Ref Range   Glucose 91 65 - 99 mg/dL   BUN 13 6 - 20 mg/dL   Creatinine, Ser 1.12 0.76 - 1.27 mg/dL   GFR calc non Af Amer 85 >59 mL/min/1.73   GFR calc Af Amer 99 >59 mL/min/1.73   BUN/Creatinine Ratio 12 9 - 20   Sodium 142 134 - 144 mmol/L   Potassium 4.4 3.5 - 5.2 mmol/L   Chloride 104 96 - 106 mmol/L   CO2 24 20 - 29 mmol/L   Calcium 9.7 8.7 - 10.2 mg/dL   Total Protein 6.7 6.0 - 8.5 g/dL   Albumin 5.0 4.0 - 5.0 g/dL   Globulin, Total 1.7 1.5 - 4.5 g/dL   Albumin/Globulin Ratio 2.9 (H) 1.2 - 2.2   Bilirubin Total 0.3 0.0 - 1.2 mg/dL   Alkaline Phosphatase 56 39 - 117 IU/L   AST 18 0 - 40 IU/L   ALT 23 0 - 44  IU/L  Lipid Panel w/o Chol/HDL Ratio  Result Value Ref Range   Cholesterol, Total 209 (H) 100 - 199 mg/dL   Triglycerides 116 0 - 149 mg/dL   HDL 41 >39 mg/dL   VLDL Cholesterol Cal 23 5 - 40 mg/dL   LDL Calculated 145 (H) 0 - 99 mg/dL  TSH  Result Value Ref Range   TSH 1.160 0.450 - 4.500 uIU/mL  UA/M w/rflx Culture, Routine   Specimen: Urine   URINE  Result Value Ref Range   Specific Gravity, UA >1.030 (H) 1.005 - 1.030   pH, UA 5.5 5.0 - 7.5   Color, UA Yellow Yellow   Appearance Ur Clear Clear   Leukocytes,UA Negative Negative   Protein,UA Negative Negative/Trace   Glucose, UA Negative Negative   Ketones, UA Negative Negative   RBC, UA Negative Negative   Bilirubin, UA Negative Negative   Urobilinogen, Ur 0.2 0.2 - 1.0 mg/dL   Nitrite, UA Negative Negative      Assessment & Plan:   Problem List Items Addressed This Visit      Other   HSV (herpes simplex virus) infection    Under good control with prn valtrex. Continue current regimen      Relevant Medications   ketoconazole (NIZORAL) 2 % cream   valACYclovir (VALTREX) 1000 MG tablet    Other Visit  Diagnoses    Right testicular pain    -  Primary   Exam fairly benign, no obvious signs of infection. Will obtain u/s given chronicity. Declines STI testing   Relevant Orders   US Scrotum   Annual physical exam       Relevant Orders   CBC with Differential/Platelet (Completed)   Comprehensive metabolic panel (Completed)   Lipid Panel w/o Chol/HDL Ratio (Completed)   TSH (Completed)   UA/M w/rflx Culture, Routine (Completed)   Tinea versicolor       ketoconazole cream sent for prn use   Flu vaccine need       Relevant Orders   Flu Vaccine QUAD 36+ mos IM (Completed)       Discussed aspirin prophylaxis for myocardial infarction prevention and decision was it was not indicated  LABORATORY TESTING:  Health maintenance labs ordered today as discussed above.   The natural history of prostate cancer and ongoing controversy regarding screening and potential treatment outcomes of prostate cancer has been discussed with the patient. The meaning of a false positive PSA and a false negative PSA has been discussed. He indicates understanding of the limitations of this screening test and wishes not to proceed with screening PSA testing.   IMMUNIZATIONS:   - Tdap: Tetanus vaccination status reviewed: last tetanus booster within 10 years. - Influenza: Administered today  PATIENT COUNSELING:    Sexuality: Discussed sexually transmitted diseases, partner selection, use of condoms, avoidance of unintended pregnancy  and contraceptive alternatives.   Advised to avoid cigarette smoking.  I discussed with the patient that most people either abstain from alcohol or drink within safe limits (<=14/week and <=4 drinks/occasion for males, <=7/weeks and <= 3 drinks/occasion for females) and that the risk for alcohol disorders and other health effects rises proportionally with the number of drinks per week and how often a drinker exceeds daily limits.  Discussed cessation/primary prevention of drug use  and availability of treatment for abuse.   Diet: Encouraged to adjust caloric intake to maintain  or achieve ideal body weight, to reduce intake of dietary saturated fat and total fat, to  limit sodium intake by avoiding high sodium foods and not adding table salt, and to maintain adequate dietary potassium and calcium preferably from fresh fruits, vegetables, and low-fat dairy products.    stressed the importance of regular exercise  Injury prevention: Discussed safety belts, safety helmets, smoke detector, smoking near bedding or upholstery.   Dental health: Discussed importance of regular tooth brushing, flossing, and dental visits.   Follow up plan: NEXT PREVENTATIVE PHYSICAL DUE IN 1 YEAR. Return in about 1 year (around 12/28/2019) for CPE.

## 2018-12-29 LAB — COMPREHENSIVE METABOLIC PANEL
ALT: 23 IU/L (ref 0–44)
AST: 18 IU/L (ref 0–40)
Albumin/Globulin Ratio: 2.9 — ABNORMAL HIGH (ref 1.2–2.2)
Albumin: 5 g/dL (ref 4.0–5.0)
Alkaline Phosphatase: 56 IU/L (ref 39–117)
BUN/Creatinine Ratio: 12 (ref 9–20)
BUN: 13 mg/dL (ref 6–20)
Bilirubin Total: 0.3 mg/dL (ref 0.0–1.2)
CO2: 24 mmol/L (ref 20–29)
Calcium: 9.7 mg/dL (ref 8.7–10.2)
Chloride: 104 mmol/L (ref 96–106)
Creatinine, Ser: 1.12 mg/dL (ref 0.76–1.27)
GFR calc Af Amer: 99 mL/min/{1.73_m2} (ref >59)
GFR calc non Af Amer: 85 mL/min/{1.73_m2} (ref >59)
Globulin, Total: 1.7 g/dL (ref 1.5–4.5)
Glucose: 91 mg/dL (ref 65–99)
Potassium: 4.4 mmol/L (ref 3.5–5.2)
Sodium: 142 mmol/L (ref 134–144)
Total Protein: 6.7 g/dL (ref 6.0–8.5)

## 2018-12-29 LAB — CBC WITH DIFFERENTIAL/PLATELET
Basophils Absolute: 0 10*3/uL (ref 0.0–0.2)
Basos: 1 %
EOS (ABSOLUTE): 0.1 10*3/uL (ref 0.0–0.4)
Eos: 2 %
Hematocrit: 40.1 % (ref 37.5–51.0)
Hemoglobin: 14.2 g/dL (ref 13.0–17.7)
Immature Grans (Abs): 0 10*3/uL (ref 0.0–0.1)
Immature Granulocytes: 1 %
Lymphocytes Absolute: 2 10*3/uL (ref 0.7–3.1)
Lymphs: 35 %
MCH: 32.7 pg (ref 26.6–33.0)
MCHC: 35.4 g/dL (ref 31.5–35.7)
MCV: 92 fL (ref 79–97)
Monocytes Absolute: 0.5 10*3/uL (ref 0.1–0.9)
Monocytes: 9 %
Neutrophils Absolute: 3 10*3/uL (ref 1.4–7.0)
Neutrophils: 52 %
Platelets: 310 10*3/uL (ref 150–450)
RBC: 4.34 x10E6/uL (ref 4.14–5.80)
RDW: 12.3 % (ref 11.6–15.4)
WBC: 5.8 10*3/uL (ref 3.4–10.8)

## 2018-12-29 LAB — TSH: TSH: 1.16 u[IU]/mL (ref 0.450–4.500)

## 2018-12-29 LAB — LIPID PANEL W/O CHOL/HDL RATIO
Cholesterol, Total: 209 mg/dL — ABNORMAL HIGH (ref 100–199)
HDL: 41 mg/dL (ref >39)
LDL Calculated: 145 mg/dL — ABNORMAL HIGH (ref 0–99)
Triglycerides: 116 mg/dL (ref 0–149)
VLDL Cholesterol Cal: 23 mg/dL (ref 5–40)

## 2018-12-31 NOTE — Assessment & Plan Note (Signed)
Under good control with prn valtrex. Continue current regimen

## 2019-01-02 ENCOUNTER — Encounter: Payer: Self-pay | Admitting: Family Medicine

## 2019-01-04 ENCOUNTER — Other Ambulatory Visit: Payer: Self-pay | Admitting: Family Medicine

## 2019-01-04 DIAGNOSIS — N50811 Right testicular pain: Secondary | ICD-10-CM

## 2019-01-10 ENCOUNTER — Ambulatory Visit
Admission: RE | Admit: 2019-01-10 | Discharge: 2019-01-10 | Disposition: A | Discharge status: Home / Self Care | Payer: BC Managed Care – PPO | Source: Ambulatory Visit | Attending: Family Medicine | Admitting: Family Medicine

## 2019-01-10 ENCOUNTER — Other Ambulatory Visit: Payer: Self-pay

## 2019-01-10 DIAGNOSIS — N50811 Right testicular pain: Secondary | ICD-10-CM | POA: Insufficient documentation

## 2019-01-10 DIAGNOSIS — I861 Scrotal varices: Secondary | ICD-10-CM | POA: Diagnosis not present

## 2019-02-25 ENCOUNTER — Other Ambulatory Visit: Payer: Self-pay

## 2019-02-25 DIAGNOSIS — Z20822 Contact with and (suspected) exposure to covid-19: Secondary | ICD-10-CM

## 2019-02-26 LAB — NOVEL CORONAVIRUS, NAA: SARS-CoV-2, NAA: DETECTED — AB

## 2019-03-04 ENCOUNTER — Encounter: Payer: Self-pay | Admitting: Family Medicine

## 2019-03-04 ENCOUNTER — Ambulatory Visit (INDEPENDENT_AMBULATORY_CARE_PROVIDER_SITE_OTHER): Payer: Managed Care, Other (non HMO) | Admitting: Family Medicine

## 2019-03-04 ENCOUNTER — Other Ambulatory Visit: Payer: Self-pay

## 2019-03-04 DIAGNOSIS — U071 COVID-19: Secondary | ICD-10-CM

## 2019-03-04 MED ORDER — PREDNISONE 50 MG PO TABS: 50.0000 mg | ORAL_TABLET | Freq: Every day | ORAL | 0 refills | Status: DC

## 2019-03-04 NOTE — Progress Notes (Signed)
There were no vitals taken for this visit.   Subjective:    Patient ID: Thomas Sawyer, male    DOB: Feb 04, 1986, 33 y.o.   MRN: [id number]  HPI: Thomas Sawyer is a 33 y.o. male  Chief Complaint  Patient presents with  . COVID    Tested positive last Monday, still having a cough and congestion.   Tested positive for COVID about a week ago, but started with symptoms a few days before that. He has been quarantining and has been home. Feeling better. But still coughing and still congested. He denies any fevers or chills. He is otherwise feeling well. No other concerns or complaints at this time.   Relevant past medical, surgical, family and social history reviewed and updated as indicated. Interim medical history since our last visit reviewed. Allergies and medications reviewed and updated.  Review of Systems  Constitutional: Negative.   HENT: Positive for congestion. Negative for dental problem, drooling, ear discharge, ear pain, facial swelling, hearing loss, mouth sores, nosebleeds, postnasal drip, rhinorrhea, sinus pressure, sinus pain, sneezing, sore throat, tinnitus, trouble swallowing and voice change.   Respiratory: Positive for cough, chest tightness and shortness of breath. Negative for apnea, choking, wheezing and stridor.   Cardiovascular: Negative.   Musculoskeletal: Negative.   Skin: Negative.   Psychiatric/Behavioral: Negative.     Per HPI unless specifically indicated above     Objective:    There were no vitals taken for this visit.  Wt Readings from Last 3 Encounters:  12/28/18 206 lb (93.4 kg)  12/22/17 189 lb 9.6 oz (86 kg)  11/08/17 191 lb 5 oz (86.8 kg)    Physical Exam Vitals signs and nursing note reviewed.  Constitutional:      General: He is not in acute distress.    Appearance: Normal appearance. He is not ill-appearing, toxic-appearing or diaphoretic.  HENT:     Head: Normocephalic and atraumatic.     Right Ear: External ear normal.     Left  Ear: External ear normal.     Nose: Nose normal.     Mouth/Throat:     Mouth: Mucous membranes are moist.     Pharynx: Oropharynx is clear.  Eyes:     General: No scleral icterus.       Right eye: No discharge.        Left eye: No discharge.     Conjunctiva/sclera: Conjunctivae normal.     Pupils: Pupils are equal, round, and reactive to light.  Neck:     Musculoskeletal: Normal range of motion.  Pulmonary:     Effort: Pulmonary effort is normal. No respiratory distress.     Comments: Speaking in full sentences Musculoskeletal: Normal range of motion.  Skin:    Coloration: Skin is not jaundiced or pale.     Findings: No bruising, erythema, lesion or rash.  Neurological:     Mental Status: He is alert and oriented to person, place, and time. Mental status is at baseline.  Psychiatric:        Mood and Affect: Mood normal.        Behavior: Behavior normal.        Thought Content: Thought content normal.        Judgment: Judgment normal.     Results for orders placed or performed in visit on 02/25/19  Novel Coronavirus, NAA ([facility])   Specimen: Nasopharyngeal(NP) swabs in vial transport medium   NASOPHARYNGE  TESTING  Result Value Ref Range  SARS-CoV-2, NAA Detected (A) Not Detected      Assessment & Plan:   Problem List Items Addressed This Visit    None    Visit Diagnoses    COVID-19    -  Primary   Continues with congestion and cough. Will treat with prednisone. Call if not getting better or getting worse. Still on quarantine for another week.        Follow up plan: Return if symptoms worsen or fail to improve.    . This visit was completed via FaceTime due to the restrictions of the COVID-19 pandemic. All issues as above were discussed and addressed. Physical exam was done as above through visual confirmation on FaceTime. If it was felt that the patient should be evaluated in the office, they were directed there. The patient verbally consented to this visit. .  Location of the patient: home . Location of the provider: home . Those involved with this call:  . Provider: Park Liter, DO . CMA: Tiffany Reel, CMA . Front Desk/Registration: Don Perking  . Time spent on call: 15 minutes with patient face to face via video conference. More than 50% of this time was spent in counseling and coordination of care. 23 minutes total spent in review of patient's record and preparation of their chart.

## 2019-12-27 ENCOUNTER — Encounter: Payer: Self-pay | Admitting: Nurse Practitioner

## 2020-01-03 ENCOUNTER — Encounter: Payer: Managed Care, Other (non HMO) | Admitting: Family Medicine

## 2020-01-03 ENCOUNTER — Encounter: Payer: Managed Care, Other (non HMO) | Admitting: Nurse Practitioner

## 2020-01-24 ENCOUNTER — Emergency Department
Admission: EM | Admit: 2020-01-24 | Discharge: 2020-01-24 | Disposition: A | Discharge status: Home / Self Care | Payer: Managed Care, Other (non HMO) | Attending: Emergency Medicine | Admitting: Emergency Medicine

## 2020-01-24 ENCOUNTER — Other Ambulatory Visit: Payer: Self-pay

## 2020-01-24 DIAGNOSIS — Y92007 Garden or yard of unspecified non-institutional (private) residence as the place of occurrence of the external cause: Secondary | ICD-10-CM | POA: Insufficient documentation

## 2020-01-24 DIAGNOSIS — W540XXA Bitten by dog, initial encounter: Secondary | ICD-10-CM | POA: Insufficient documentation

## 2020-01-24 DIAGNOSIS — Z87891 Personal history of nicotine dependence: Secondary | ICD-10-CM | POA: Insufficient documentation

## 2020-01-24 DIAGNOSIS — S20311A Abrasion of right front wall of thorax, initial encounter: Secondary | ICD-10-CM | POA: Diagnosis present

## 2020-01-24 DIAGNOSIS — Y93H2 Activity, gardening and landscaping: Secondary | ICD-10-CM | POA: Insufficient documentation

## 2020-01-24 MED ORDER — AMOXICILLIN-POT CLAVULANATE 875-125 MG PO TABS: 1.0000 | ORAL_TABLET | Freq: Two times a day (BID) | ORAL | 0 refills | Status: AC

## 2020-01-24 NOTE — ED Triage Notes (Addendum)
Pt comes with c/o dog bite. Pt states his neighbors dog attacked him. Pt was cutting tree limbs and the dog came and just jumped on him. Pt has large bite wound noted to right upper side. Bleeding controlled.  This attack has not been reported to [employer] or Police yet.  First RN Jinny Blossom to call [employer] and report attack.

## 2020-01-24 NOTE — ED Notes (Signed)
See triage note, bite mark noted on right anterior ribcage, no active bleeding at this time.

## 2020-01-24 NOTE — ED Notes (Signed)
Wound dressed with a non-adherent bandage and covered with Tegaderm. Patient tolerated procedure well.

## 2020-01-24 NOTE — ED Provider Notes (Signed)
[facility] Emergency Department Provider Note  ____________________________________________  Time seen: Approximately 6:08 PM  I have reviewed the triage vital signs and the nursing notes.   HISTORY  Chief Complaint Dog BIte    HPI Thomas Sawyer is a 34 y.o. male that presents to the emergency department for evaluation of dog bite today.  Patient was out doing yard work when his neighbors dog bit him through their shared fence.  Patient was bit to his right rib cage.  He states that it looks more like a large abrasion than a bite.  Neighbors states that dog's rabies vaccine is up-to-date but he did not see any proof.  Patient called the police to report the incident but they recommended that he come to the ED to be evaluated and notify us here.  Patient's tetanus shot is up-to-date. He caught the attack on his ring camera video.  Past Medical History:  Diagnosis Date  . ADHD (attention deficit hyperactivity disorder)   . Anxiety   . Depression   . Depression   . Genital warts   . Oppositional defiant behavior     Patient Active Problem List   Diagnosis Date Noted  . HSV (herpes simplex virus) infection 12/27/2017  . Hx of substance abuse ([place]) 11/13/2017  . Insomnia 11/08/2017  . Nicotine dependence, cigarettes, uncomplicated 03/50/0938  . Genital warts   . Depression   . ADHD (attention deficit hyperactivity disorder)   . Oppositional defiant behavior     Past Surgical History:  Procedure Laterality Date  . BRAIN SURGERY  2011    Prior to Admission medications   Medication Sig Start Date End Date Taking? Authorizing Provider  amoxicillin-clavulanate (AUGMENTIN) 875-125 MG tablet Take 1 tablet by mouth 2 (two) times daily for 10 days. 01/24/20 02/03/20  Laban Emperor, PA-C  ketoconazole (NIZORAL) 2 % cream Apply 1 application topically daily. 12/28/18   Volney American, PA-C  predniSONE (DELTASONE) 50 MG tablet Take 1 tablet (50 mg total)  by mouth daily with breakfast. 03/04/19   Park Liter P, DO  valACYclovir (VALTREX) 1000 MG tablet Take 1 tablet (1,000 mg total) by mouth 2 (two) times daily as needed. 12/28/18   Volney American, PA-C    Allergies Patient has no known allergies.  Family History  Problem Relation Age of Onset  . Asthma Father   . Diabetes Father   . Cancer Neg Hx   . COPD Neg Hx   . Heart disease Neg Hx   . Hypertension Neg Hx   . Stroke Neg Hx   . Prostate cancer Neg Hx   . Sickle cell trait Neg Hx     Social History Social History   Tobacco Use  . Smoking status: Former Smoker    Packs/day: 0.50    Years: 18.00    Pack years: 9.00    Types: Cigarettes  . Smokeless tobacco: Current User  Vaping Use  . Vaping Use: Former  Substance Use Topics  . Alcohol use: Not Currently    Comment: rare  . Drug use: Not Currently    Types: Marijuana     Review of Systems  Cardiovascular: No chest pain. Respiratory: No SOB. Gastrointestinal: No abdominal pain.  No nausea, no vomiting.  Musculoskeletal: Negative for musculoskeletal pain. Skin: Negative for rash, lacerations, ecchymosis. Positive for abrasion.   ____________________________________________   PHYSICAL EXAM:  VITAL SIGNS: ED Triage Vitals [01/24/20 1625]  Enc Vitals Group     BP Marland Kitchen)  143/89     Pulse Rate (!) 109     Resp 18     Temp 98 F (36.7 C)     Temp src      SpO2 100 %     Weight 213 lb (96.6 kg)     Height 6\' 1"  (1.854 m)     Head Circumference      Peak Flow      Pain Score 5     Pain Loc      Pain Edu?      Excl. in [place]?      Constitutional: Alert and oriented. Well appearing and in no acute distress. Eyes: Conjunctivae are normal. PERRL. EOMI. Head: Atraumatic. ENT:      Ears:      Nose: No congestion/rhinnorhea.      Mouth/Throat: Mucous membranes are moist.  Neck: No stridor. Cardiovascular: Normal rate, regular rhythm.  Good peripheral circulation. Respiratory: Normal respiratory  effort without tachypnea or retractions. Lungs CTAB. Good air entry to the bases with no decreased or absent breath sounds. Musculoskeletal: Full range of motion to all extremities. No gross deformities appreciated. Neurologic:  Normal speech and language. No gross focal neurologic deficits are appreciated.  Skin:  Skin is warm, dry. 4cm by 3 cm abrasion to right rib cage without laceration. Psychiatric: Mood and affect are normal. Speech and behavior are normal. Patient exhibits appropriate insight and judgement.   ____________________________________________   LABS (all labs ordered are listed, but only abnormal results are displayed)  Labs Reviewed - No data to display ____________________________________________  EKG   ____________________________________________  RADIOLOGY   No results found.  ____________________________________________    PROCEDURES  Procedure(s) performed:    Procedures    Medications - No data to display   ____________________________________________   INITIAL IMPRESSION / ASSESSMENT AND PLAN / ED COURSE  Pertinent labs & imaging results that were available during my care of the patient were reviewed by me and considered in my medical decision making (see chart for details).  Review of the [place] CSRS was performed in accordance of the [place] prior to dispensing any controlled drugs.   Patient presented to emergency department for evaluation of dog bite.  Vital signs and exam are reassuring.  Patient has large abrasion to his right rib cage.  There is no laceration.  His tetanus is up-to-date.  Patient will confirm with his neighbors and place the dogs rabies vaccine is up-to-date.  He understands that he must return to the emergency department for rabies vaccine series next week if dog is not up-to-date on rabies.  I do not expect that patient will have any problems with infection since there is no laceration.  Patient will be discharged home  with prescriptions for Augmentin that he will begin only if he notices that area is infected.  Education about wound infections was provided.  Patient is to follow up with primary care as directed. Patient is given ED precautions to return to the ED for any worsening or new symptoms.   Thomas Sawyer was evaluated in Emergency Department on 01/24/2020 for the symptoms described in the history of present illness. He was evaluated in the context of the global COVID-19 pandemic, which necessitated consideration that the patient might be at risk for infection with the SARS-CoV-2 virus that causes COVID-19. Institutional protocols and algorithms that pertain to the evaluation of patients at risk for COVID-19 are in a state of rapid change based on information released by regulatory  bodies including the CDC and federal and state organizations. These policies and algorithms were followed during the patient's care in the ED.  ____________________________________________  FINAL CLINICAL IMPRESSION(S) / ED DIAGNOSES  Final diagnoses:  Dog bite, initial encounter      NEW MEDICATIONS STARTED DURING THIS VISIT:  ED Discharge Orders         Ordered    amoxicillin-clavulanate (AUGMENTIN) 875-125 MG tablet  2 times daily        01/24/20 1817              This chart was dictated using voice recognition software/Dragon. Despite best efforts to proofread, errors can occur which can change the meaning. Any change was purely unintentional.    Laban Emperor, PA-C 01/24/20 Nicholos Johns, MD 01/26/20 1455

## 2020-02-07 ENCOUNTER — Encounter: Payer: Self-pay | Admitting: Nurse Practitioner

## 2020-02-07 ENCOUNTER — Ambulatory Visit (INDEPENDENT_AMBULATORY_CARE_PROVIDER_SITE_OTHER): Payer: Managed Care, Other (non HMO) | Admitting: Nurse Practitioner

## 2020-02-07 ENCOUNTER — Other Ambulatory Visit: Payer: Self-pay

## 2020-02-07 VITALS — BP 124/77 | HR 74 | Temp 98.1°F | Ht 73.0 in | Wt 216.0 lb

## 2020-02-07 DIAGNOSIS — Z0000 Encounter for general adult medical examination without abnormal findings: Secondary | ICD-10-CM | POA: Diagnosis not present

## 2020-02-07 DIAGNOSIS — E663 Overweight: Secondary | ICD-10-CM | POA: Insufficient documentation

## 2020-02-07 DIAGNOSIS — Z1159 Encounter for screening for other viral diseases: Secondary | ICD-10-CM

## 2020-02-07 DIAGNOSIS — E669 Obesity, unspecified: Secondary | ICD-10-CM | POA: Insufficient documentation

## 2020-02-07 MED ORDER — VALACYCLOVIR HCL 1 G PO TABS: 1000.0000 mg | ORAL_TABLET | Freq: Two times a day (BID) | ORAL | 3 refills | Status: DC | PRN

## 2020-02-07 NOTE — Patient Instructions (Signed)
Healthy Eating Following a healthy eating pattern may help you to achieve and maintain a healthy body weight, reduce the risk of chronic disease, and live a long and productive life. It is important to follow a healthy eating pattern at an appropriate calorie level for your body. Your nutritional needs should be met primarily through food by choosing a variety of nutrient-rich foods. What are tips for following this plan? Reading food labels  Read labels and choose the following: ? Reduced or low sodium. ? Juices with 100% fruit juice. ? Foods with low saturated fats and high polyunsaturated and monounsaturated fats. ? Foods with whole grains, such as whole wheat, cracked wheat, brown rice, and wild rice. ? Whole grains that are fortified with folic acid. This is recommended for women who are pregnant or who want to become pregnant.  Read labels and avoid the following: ? Foods with a lot of added sugars. These include foods that contain brown sugar, corn sweetener, corn syrup, dextrose, fructose, glucose, high-fructose corn syrup, honey, invert sugar, lactose, malt syrup, maltose, molasses, raw sugar, sucrose, trehalose, or turbinado sugar.  Do not eat more than the following amounts of added sugar per day:  6 teaspoons (25 g) for women.  9 teaspoons (38 g) for men. ? Foods that contain processed or refined starches and grains. ? Refined grain products, such as white flour, degermed cornmeal, white bread, and white rice. Shopping  Choose nutrient-rich snacks, such as vegetables, whole fruits, and nuts. Avoid high-calorie and high-sugar snacks, such as potato chips, fruit snacks, and candy.  Use oil-based dressings and spreads on foods instead of solid fats such as butter, stick margarine, or cream cheese.  Limit pre-made sauces, mixes, and "instant" products such as flavored rice, instant noodles, and ready-made pasta.  Try more plant-protein sources, such as tofu, tempeh, black beans,  edamame, lentils, nuts, and seeds.  Explore eating plans such as the Mediterranean diet or vegetarian diet. Cooking  Use oil to saut or stir-fry foods instead of solid fats such as butter, stick margarine, or lard.  Try baking, boiling, grilling, or broiling instead of frying.  Remove the fatty part of meats before cooking.  Steam vegetables in water or broth. Meal planning   At meals, imagine dividing your plate into fourths: ? One-half of your plate is fruits and vegetables. ? One-fourth of your plate is whole grains. ? One-fourth of your plate is protein, especially lean meats, poultry, eggs, tofu, beans, or nuts.  Include low-fat dairy as part of your daily diet. Lifestyle  Choose healthy options in all settings, including home, work, school, restaurants, or stores.  Prepare your food safely: ? Wash your hands after handling raw meats. ? Keep food preparation surfaces clean by regularly washing with hot, soapy water. ? Keep raw meats separate from ready-to-eat foods, such as fruits and vegetables. ? Cook seafood, meat, poultry, and eggs to the recommended internal temperature. ? Store foods at safe temperatures. In general:  Keep cold foods at 59F (4.4C) or below.  Keep hot foods at 159F (60C) or above.  Keep your freezer at [facility] (-17.8C) or below.  Foods are no longer safe to eat when they have been between the temperatures of 40-159F (4.4-60C) for more than 2 hours. What foods should I eat? Fruits Aim to eat 2 cup-equivalents of fresh, canned (in natural juice), or frozen fruits each day. Examples of 1 cup-equivalent of fruit include 1 small apple, 8 large strawberries, 1 cup canned fruit,  cup  dried fruit, or 1 cup 100% juice. Vegetables Aim to eat 2-3 cup-equivalents of fresh and frozen vegetables each day, including different varieties and colors. Examples of 1 cup-equivalent of vegetables include 2 medium carrots, 2 cups raw, leafy greens, 1 cup chopped  vegetable (raw or cooked), or 1 medium baked potato. Grains Aim to eat 6 ounce-equivalents of whole grains each day. Examples of 1 ounce-equivalent of grains include 1 slice of bread, 1 cup ready-to-eat cereal, 3 cups popcorn, or  cup cooked rice, pasta, or cereal. Meats and other proteins Aim to eat 5-6 ounce-equivalents of protein each day. Examples of 1 ounce-equivalent of protein include 1 egg, 1/2 cup nuts or seeds, or 1 tablespoon (16 g) peanut butter. A cut of meat or fish that is the size of a deck of cards is about 3-4 ounce-equivalents.  Of the protein you eat each week, try to have at least 8 ounces come from seafood. This includes salmon, trout, herring, and anchovies. Dairy Aim to eat 3 cup-equivalents of fat-free or low-fat dairy each day. Examples of 1 cup-equivalent of dairy include 1 cup (240 mL) milk, 8 ounces (250 g) yogurt, 1 ounces (44 g) natural cheese, or 1 cup (240 mL) fortified soy milk. Fats and oils  Aim for about 5 teaspoons (21 g) per day. Choose monounsaturated fats, such as canola and olive oils, avocados, peanut butter, and most nuts, or polyunsaturated fats, such as sunflower, corn, and soybean oils, walnuts, pine nuts, sesame seeds, sunflower seeds, and flaxseed. Beverages  Aim for six 8-oz glasses of water per day. Limit coffee to three to five 8-oz cups per day.  Limit caffeinated beverages that have added calories, such as soda and energy drinks.  Limit alcohol intake to no more than 1 drink a day for nonpregnant women and 2 drinks a day for men. One drink equals 12 oz of beer (355 mL), 5 oz of wine (148 mL), or 1 oz of hard liquor (44 mL). Seasoning and other foods  Avoid adding excess amounts of salt to your foods. Try flavoring foods with herbs and spices instead of salt.  Avoid adding sugar to foods.  Try using oil-based dressings, sauces, and spreads instead of solid fats. This information is based on general [place] nutrition guidelines. For more  information, visit [employer]. Exact amounts may vary based on your nutrition needs. Summary  A healthy eating plan may help you to maintain a healthy weight, reduce the risk of chronic diseases, and stay active throughout your life.  Plan your meals. Make sure you eat the right portions of a variety of nutrient-rich foods.  Try baking, boiling, grilling, or broiling instead of frying.  Choose healthy options in all settings, including home, work, school, restaurants, or stores. This information is not intended to replace advice given to you by your health care provider. Make sure you discuss any questions you have with your health care provider. Document Revised: 07/31/2017 Document Reviewed: 07/31/2017 Elsevier Patient Education  [place].

## 2020-02-07 NOTE — Addendum Note (Signed)
Addended by: Marnee Guarneri T on: 02/07/2020 01:44 PM   Modules accepted: Orders

## 2020-02-07 NOTE — Progress Notes (Signed)
BP 124/77 (BP Location: Right Arm, Patient Position: Sitting, Cuff Size: Large)   Pulse 74   Temp 98.1 F (36.7 C) (Oral)   Ht 6\' 1"  (1.854 m)   Wt 216 lb (98 kg)   SpO2 97%   BMI 28.50 kg/m    Subjective:    Patient ID: Thomas Sawyer, male    DOB: Jan 04, 1986, 34 y.o.   MRN: [id number]  HPI: Thomas Sawyer is a 34 y.o. male presenting on 02/07/2020 for comprehensive medical examination. Current medical complaints include:none  He currently lives with: spouse Interim Problems from his last visit: no  Functional Status Survey: Is the patient deaf or have difficulty hearing?: Yes Does the patient have difficulty seeing, even when wearing glasses/contacts?: No Does the patient have difficulty concentrating, remembering, or making decisions?: No Does the patient have difficulty walking or climbing stairs?: No Does the patient have difficulty dressing or bathing?: No Does the patient have difficulty doing errands alone such as visiting a doctor's office or shopping?: No  FALL RISK: Fall Risk  02/07/2020 12/28/2018  Falls in the past year? 0 1  Number falls in past yr: 0 0  Injury with Fall? 0 1  Risk for fall due to : No Fall Risks -  Follow up Falls evaluation completed -    Depression Screen Depression screen [facility] 2/9 02/07/2020 12/28/2018 07/06/2015  Decreased Interest 0 0 0  Down, Depressed, Hopeless 0 0 0  PHQ - 2 Score 0 0 0  Altered sleeping - 1 -  Tired, decreased energy - 1 -  Change in appetite - 0 -  Feeling bad or failure about yourself  - 0 -  Trouble concentrating - 1 -  Moving slowly or fidgety/restless - 0 -  Suicidal thoughts - 0 -  PHQ-9 Score - 3 -    Advanced Directives <no information>  Past Medical History:  Past Medical History:  Diagnosis Date  . ADHD (attention deficit hyperactivity disorder)   . Anxiety   . Depression   . Depression   . Genital warts   . Oppositional defiant behavior     Surgical History:  Past Surgical History:    Procedure Laterality Date  . BRAIN SURGERY  2011    Medications:  Current Outpatient Medications on File Prior to Visit  Medication Sig  . valACYclovir (VALTREX) 1000 MG tablet Take 1 tablet (1,000 mg total) by mouth 2 (two) times daily as needed.   No current facility-administered medications on file prior to visit.    Allergies:  No Known Allergies  Social History:  Social History   Socioeconomic History  . Marital status: Married    Spouse name: Not on file  . Number of children: Not on file  . Years of education: Not on file  . Highest education level: Not on file  Occupational History  . Not on file  Tobacco Use  . Smoking status: Former Smoker    Packs/day: 0.50    Years: 18.00    Pack years: 9.00    Types: Cigarettes  . Smokeless tobacco: Current User  Vaping Use  . Vaping Use: Former  Substance and Sexual Activity  . Alcohol use: Not Currently    Comment: rare  . Drug use: Not Currently    Types: Marijuana  . Sexual activity: Not on file  Other Topics Concern  . Not on file  Social History Narrative  . Not on file   Social Determinants of Health  Financial Resource Strain:   . Difficulty of Paying Living Expenses: Not on file  Food Insecurity:   . Worried About Charity fundraiser in the Last Year: Not on file  . Ran Out of Food in the Last Year: Not on file  Transportation Needs:   . Lack of Transportation (Medical): Not on file  . Lack of Transportation (Non-Medical): Not on file  Physical Activity:   . Days of Exercise per Week: Not on file  . Minutes of Exercise per Session: Not on file  Stress:   . Feeling of Stress : Not on file  Social Connections:   . Frequency of Communication with Friends and Family: Not on file  . Frequency of Social Gatherings with Friends and Family: Not on file  . Attends Religious Services: Not on file  . Active Member of Clubs or Organizations: Not on file  . Attends Archivist Meetings: Not on  file  . Marital Status: Not on file  Intimate Partner Violence:   . Fear of Current or Ex-Partner: Not on file  . Emotionally Abused: Not on file  . Physically Abused: Not on file  . Sexually Abused: Not on file   Social History   Tobacco Use  Smoking Status Former Smoker  . Packs/day: 0.50  . Years: 18.00  . Pack years: 9.00  . Types: Cigarettes  Smokeless Tobacco Current User   Social History   Substance and Sexual Activity  Alcohol Use Not Currently   Comment: rare    Family History:  Family History  Problem Relation Age of Onset  . Asthma Father   . Diabetes Father   . Cancer Neg Hx   . COPD Neg Hx   . Heart disease Neg Hx   . Hypertension Neg Hx   . Stroke Neg Hx   . Prostate cancer Neg Hx   . Sickle cell trait Neg Hx     Past medical history, surgical history, medications, allergies, family history and social history reviewed with patient today and changes made to appropriate areas of the chart.   Review of Systems - negative All other ROS negative except what is listed above and in the HPI.      Objective:    BP 124/77 (BP Location: Right Arm, Patient Position: Sitting, Cuff Size: Large)   Pulse 74   Temp 98.1 F (36.7 C) (Oral)   Ht 6\' 1"  (1.854 m)   Wt 216 lb (98 kg)   SpO2 97%   BMI 28.50 kg/m   Wt Readings from Last 3 Encounters:  02/07/20 216 lb (98 kg)  01/24/20 213 lb (96.6 kg)  12/28/18 206 lb (93.4 kg)    Physical Exam Vitals and nursing note reviewed.  Constitutional:      General: He is awake. He is not in acute distress.    Appearance: He is well-developed and well-groomed. He is not ill-appearing.  HENT:     Head: Normocephalic and atraumatic.     Right Ear: Hearing, tympanic membrane, ear canal and external ear normal. No drainage.     Left Ear: Hearing, tympanic membrane, ear canal and external ear normal. No drainage.     Nose: Nose normal.     Mouth/Throat:     Pharynx: Uvula midline.  Eyes:     General: Lids are  normal.        Right eye: No discharge.        Left eye: No discharge.  Extraocular Movements: Extraocular movements intact.     Conjunctiva/sclera: Conjunctivae normal.     Pupils: Pupils are equal, round, and reactive to light.     Visual Fields: Right eye visual fields normal and left eye visual fields normal.  Neck:     Thyroid: No thyromegaly.     Vascular: No carotid bruit or JVD.     Trachea: Trachea normal.  Cardiovascular:     Rate and Rhythm: Normal rate and regular rhythm.     Heart sounds: Normal heart sounds, S1 normal and S2 normal. No murmur heard.  No gallop.   Pulmonary:     Effort: Pulmonary effort is normal. No accessory muscle usage or respiratory distress.     Breath sounds: Normal breath sounds.  Abdominal:     General: Bowel sounds are normal.     Palpations: Abdomen is soft. There is no hepatomegaly or splenomegaly.     Tenderness: There is no abdominal tenderness.  Musculoskeletal:        General: Normal range of motion.     Cervical back: Normal range of motion and neck supple.     Right lower leg: No edema.     Left lower leg: No edema.  Lymphadenopathy:     Head:     Right side of head: No submental, submandibular, tonsillar, preauricular or posterior auricular adenopathy.     Left side of head: No submental, submandibular, tonsillar, preauricular or posterior auricular adenopathy.     Cervical: No cervical adenopathy.  Skin:    General: Skin is warm and dry.     Capillary Refill: Capillary refill takes less than 2 seconds.     Findings: No rash.     Comments: Multiple tattoos and piercing's present.  Neurological:     Mental Status: He is alert and oriented to person, place, and time.     Cranial Nerves: Cranial nerves are intact.     Gait: Gait is intact.     Deep Tendon Reflexes: Reflexes are normal and symmetric.     Reflex Scores:      Brachioradialis reflexes are 2+ on the right side and 2+ on the left side.      Patellar reflexes are  2+ on the right side and 2+ on the left side. Psychiatric:        Attention and Perception: Attention normal.        Mood and Affect: Mood normal.        Speech: Speech normal.        Behavior: Behavior normal. Behavior is cooperative.        Thought Content: Thought content normal.        Cognition and Memory: Cognition normal.        Judgment: Judgment normal.     Results for orders placed or performed in visit on 02/25/19  Novel Coronavirus, NAA ([facility])   Specimen: Nasopharyngeal(NP) swabs in vial transport medium   NASOPHARYNGE  TESTING  Result Value Ref Range   SARS-CoV-2, NAA Detected (A) Not Detected      Assessment & Plan:   Problem List Items Addressed This Visit      Other   Overweight (BMI 25.0-29.9)    Recommended eating smaller high protein, low fat meals more frequently and exercising 30 mins a day 5 times a week with a goal of 10-15lb weight loss in the next 3 months. Patient voiced their understanding and motivation to adhere to these recommendations.  Other Visit Diagnoses    Routine general medical examination at a health care facility    -  Primary   Annual routine labs today to include CBC, CMP, TSH, Lipid   Relevant Orders   CBC with Differential/Platelet   Comprehensive metabolic panel   Lipid Panel w/o Chol/HDL Ratio   TSH   Need for hepatitis C screening test       Obtain Hep C screening on labs today.   Relevant Orders   Hepatitis C antibody      LABORATORY TESTING:  Health maintenance labs ordered today as discussed above.   IMMUNIZATIONS:   - Tdap: Tetanus vaccination status reviewed: last tetanus booster within 10 years. - Influenza: received at work on Tuesday - Pneumovax: Not applicable - Prevnar: Not applicable - Zostavax vaccine: Not applicable  SCREENING: - Colonoscopy: Not applicable  Discussed with patient purpose of the colonoscopy is to detect colon cancer at curable precancerous or early stages   - AAA Screening:  Not applicable  -Hearing Test: Not applicable  -Spirometry: Not applicable   PATIENT COUNSELING:    Sexuality: Discussed sexually transmitted diseases, partner selection, use of condoms, avoidance of unintended pregnancy  and contraceptive alternatives.   Advised to avoid cigarette smoking.  I discussed with the patient that most people either abstain from alcohol or drink within safe limits (<=14/week and <=4 drinks/occasion for males, <=7/weeks and <= 3 drinks/occasion for females) and that the risk for alcohol disorders and other health effects rises proportionally with the number of drinks per week and how often a drinker exceeds daily limits.  Discussed cessation/primary prevention of drug use and availability of treatment for abuse.   Diet: Encouraged to adjust caloric intake to maintain  or achieve ideal body weight, to reduce intake of dietary saturated fat and total fat, to limit sodium intake by avoiding high sodium foods and not adding table salt, and to maintain adequate dietary potassium and calcium preferably from fresh fruits, vegetables, and low-fat dairy products.    stressed the importance of regular exercise  Injury prevention: Discussed safety belts, safety helmets, smoke detector, smoking near bedding or upholstery.   Dental health: Discussed importance of regular tooth brushing, flossing, and dental visits.   Follow up plan: NEXT PREVENTATIVE PHYSICAL DUE IN 1 YEAR. Return in about 1 year (around 02/06/2021) for Annual physical.

## 2020-02-07 NOTE — Assessment & Plan Note (Signed)
Recommended eating smaller high protein, low fat meals more frequently and exercising 30 mins a day 5 times a week with a goal of 10-15lb weight loss in the next 3 months. Patient voiced their understanding and motivation to adhere to these recommendations.  

## 2020-02-08 LAB — CBC WITH DIFFERENTIAL/PLATELET
Basophils Absolute: 0 10*3/uL (ref 0.0–0.2)
Basos: 1 %
EOS (ABSOLUTE): 0.1 10*3/uL (ref 0.0–0.4)
Eos: 1 %
Hematocrit: 41.7 % (ref 37.5–51.0)
Hemoglobin: 14 g/dL (ref 13.0–17.7)
Immature Grans (Abs): 0 10*3/uL (ref 0.0–0.1)
Immature Granulocytes: 0 %
Lymphocytes Absolute: 1.7 10*3/uL (ref 0.7–3.1)
Lymphs: 24 %
MCH: 31.7 pg (ref 26.6–33.0)
MCHC: 33.6 g/dL (ref 31.5–35.7)
MCV: 95 fL (ref 79–97)
Monocytes Absolute: 0.4 10*3/uL (ref 0.1–0.9)
Monocytes: 6 %
Neutrophils Absolute: 4.8 10*3/uL (ref 1.4–7.0)
Neutrophils: 68 %
Platelets: 332 10*3/uL (ref 150–450)
RBC: 4.41 x10E6/uL (ref 4.14–5.80)
RDW: 12.3 % (ref 11.6–15.4)
WBC: 7 10*3/uL (ref 3.4–10.8)

## 2020-02-08 LAB — COMPREHENSIVE METABOLIC PANEL
ALT: 30 IU/L (ref 0–44)
AST: 18 IU/L (ref 0–40)
Albumin/Globulin Ratio: 2.5 — ABNORMAL HIGH (ref 1.2–2.2)
Albumin: 5 g/dL (ref 4.0–5.0)
Alkaline Phosphatase: 59 IU/L (ref 44–121)
BUN/Creatinine Ratio: 14 (ref 9–20)
BUN: 15 mg/dL (ref 6–20)
Bilirubin Total: 0.4 mg/dL (ref 0.0–1.2)
CO2: 21 mmol/L (ref 20–29)
Calcium: 9.6 mg/dL (ref 8.7–10.2)
Chloride: 101 mmol/L (ref 96–106)
Creatinine, Ser: 1.06 mg/dL (ref 0.76–1.27)
GFR calc Af Amer: 104 mL/min/{1.73_m2} (ref >59)
GFR calc non Af Amer: 90 mL/min/{1.73_m2} (ref >59)
Globulin, Total: 2 g/dL (ref 1.5–4.5)
Glucose: 92 mg/dL (ref 65–99)
Potassium: 4.1 mmol/L (ref 3.5–5.2)
Sodium: 140 mmol/L (ref 134–144)
Total Protein: 7 g/dL (ref 6.0–8.5)

## 2020-02-08 LAB — TSH: TSH: 0.532 u[IU]/mL (ref 0.450–4.500)

## 2020-02-08 LAB — LIPID PANEL W/O CHOL/HDL RATIO
Cholesterol, Total: 237 mg/dL — ABNORMAL HIGH (ref 100–199)
HDL: 44 mg/dL (ref >39)
LDL Chol Calc (NIH): 179 mg/dL — ABNORMAL HIGH (ref 0–99)
Triglycerides: 81 mg/dL (ref 0–149)
VLDL Cholesterol Cal: 14 mg/dL (ref 5–40)

## 2020-02-08 LAB — HEPATITIS C ANTIBODY: Hep C Virus Ab: <0.1 s/co ratio (ref 0.0–0.9)

## 2020-02-08 NOTE — Progress Notes (Signed)
Contacted via [facility].  Your labs have returned and they overall look great, Hep C is negative.  The only elevations were cholesterol levels.  Your cholesterol is still high, but continued recommendations to make lifestyle changes. Your LDL is above normal. The LDL is the bad cholesterol. Over time and in combination with inflammation and other factors, this contributes to plaque which in turn may lead to stroke and/or heart attack down the road. Sometimes high LDL is primarily genetic, and people might be eating all the right foods but still have high numbers. Other times, there is room for improvement in one's diet and eating healthier can bring this number down and potentially reduce one's risk of heart attack and/or stroke.   To reduce your LDL, Remember - more fruits and vegetables, more fish, and limit red meat and dairy products. More soy, nuts, beans, barley, lentils, oats and plant sterol ester enriched margarine instead of butter. I also encourage eliminating sugar and processed food. Remember, shop on the outside of the grocery store and visit your [employer]. If you would like to talk with me about dietary changes for your cholesterol, please let me know. We should recheck your cholesterol in 12 months. Keep being awesome!!  Thank you for allowing me to participate in your care. Kindest regards, Haeven Nickle

## 2020-05-12 ENCOUNTER — Other Ambulatory Visit: Payer: Self-pay | Admitting: Nurse Practitioner

## 2020-05-12 ENCOUNTER — Telehealth: Payer: Self-pay

## 2020-05-12 DIAGNOSIS — K219 Gastro-esophageal reflux disease without esophagitis: Secondary | ICD-10-CM

## 2020-05-12 NOTE — Telephone Encounter (Signed)
I have placed this referral for him, thank you.

## 2020-05-12 NOTE — Telephone Encounter (Signed)
Called pt he is having acid reflux symptoms with burping and his stomach is hurting and burning. Pt states that nothing is helping and he has called [facility] and they need a referral. Please advise if pt needs an appt.  Copied from [place] [id number]. Topic: General - Other >> May 12, 2020  8:52 AM Keene Breath wrote: Reason for CRM: Patient called to request a referral to the [facility] clinic for gastric issues.  He stated that clinic told him he did not need an appt. With his PCP and that the doctor just needed to fax the information to them.  Please advise and call patient to discuss at [phone]

## 2020-05-12 NOTE — Telephone Encounter (Signed)
Called pt advised referral has been placed. Pt verbalized understanding

## 2020-05-14 DIAGNOSIS — K219 Gastro-esophageal reflux disease without esophagitis: Secondary | ICD-10-CM | POA: Insufficient documentation

## 2021-02-06 ENCOUNTER — Encounter: Payer: Self-pay | Admitting: Nurse Practitioner

## 2021-02-12 ENCOUNTER — Other Ambulatory Visit: Payer: Self-pay

## 2021-02-12 ENCOUNTER — Encounter: Payer: Self-pay | Admitting: Nurse Practitioner

## 2021-02-12 ENCOUNTER — Ambulatory Visit (INDEPENDENT_AMBULATORY_CARE_PROVIDER_SITE_OTHER): Payer: Managed Care, Other (non HMO) | Admitting: Nurse Practitioner

## 2021-02-12 VITALS — BP 117/78 | HR 77 | Temp 98.1°F | Ht 72.0 in | Wt 213.0 lb

## 2021-02-12 DIAGNOSIS — Z136 Encounter for screening for cardiovascular disorders: Secondary | ICD-10-CM | POA: Diagnosis not present

## 2021-02-12 DIAGNOSIS — N50811 Right testicular pain: Secondary | ICD-10-CM

## 2021-02-12 DIAGNOSIS — Z0000 Encounter for general adult medical examination without abnormal findings: Secondary | ICD-10-CM | POA: Diagnosis not present

## 2021-02-12 DIAGNOSIS — F17210 Nicotine dependence, cigarettes, uncomplicated: Secondary | ICD-10-CM

## 2021-02-12 NOTE — Progress Notes (Signed)
BP 117/78   Pulse 77   Temp 98.1 F (36.7 C) (Oral)   Ht 6' (1.829 m)   Wt 213 lb (96.6 kg)   SpO2 98%   BMI 28.89 kg/m    Subjective:    Patient ID: Thomas Sawyer, male    DOB: [birth year], 35 y.o.   MRN: [id number]  HPI: Thomas Sawyer is a 35 y.o. male presenting on 02/12/2021 for comprehensive medical examination. Current medical complaints include:none  He currently lives with: spouse Interim Problems from his last visit: no  Seen for right testicular pain last 12/28/2018.  Imaging at time showed no concerns to right, but did notice a small left varicocele.  States he was informed if ongoing could go to urology.  Continues to have a sharp pain to right testicle -- and would like to see urology.  Functional Status Survey: Is the patient deaf or have difficulty hearing?: No Does the patient have difficulty seeing, even when wearing glasses/contacts?: No Does the patient have difficulty concentrating, remembering, or making decisions?: No Does the patient have difficulty walking or climbing stairs?: No Does the patient have difficulty dressing or bathing?: No Does the patient have difficulty doing errands alone such as visiting a doctor's office or shopping?: No  FALL RISK: Fall Risk  02/12/2021 02/07/2020 12/28/2018  Falls in the past year? 0 0 1  Number falls in past yr: 0 0 0  Injury with Fall? 0 0 1  Risk for fall due to : No Fall Risks No Fall Risks -  Follow up Falls prevention discussed Falls evaluation completed -    Depression Screen Depression screen [facility] 2/9 02/12/2021 02/07/2020 12/28/2018 07/06/2015  Decreased Interest 0 0 0 0  Down, Depressed, Hopeless 0 0 0 0  PHQ - 2 Score 0 0 0 0  Altered sleeping 0 - 1 -  Tired, decreased energy 0 - 1 -  Change in appetite 0 - 0 -  Feeling bad or failure about yourself  0 - 0 -  Trouble concentrating 0 - 1 -  Moving slowly or fidgety/restless 0 - 0 -  Suicidal thoughts 0 - 0 -  PHQ-9 Score 0 - 3 -  Difficult doing  work/chores Not difficult at all - - -    Advanced Directives <no information>  Past Medical History:  Past Medical History:  Diagnosis Date   ADHD (attention deficit hyperactivity disorder)    Anxiety    Depression    Depression    Genital warts    Hiatal hernia    Oppositional defiant behavior     Surgical History:  Past Surgical History:  Procedure Laterality Date   BRAIN SURGERY  2011    Medications:  Current Outpatient Medications on File Prior to Visit  Medication Sig   pantoprazole (PROTONIX) 40 MG tablet Take by mouth.   valACYclovir (VALTREX) 1000 MG tablet Take 1 tablet (1,000 mg total) by mouth 2 (two) times daily as needed.   valACYclovir (VALTREX) 1000 MG tablet Take by mouth.   No current facility-administered medications on file prior to visit.    Allergies:  No Known Allergies  Social History:  Social History   Socioeconomic History   Marital status: Married    Spouse name: Not on file   Number of children: Not on file   Years of education: Not on file   Highest education level: Not on file  Occupational History   Not on file  Tobacco Use   Smoking  status: Former    Packs/day: 0.50    Years: 18.00    Pack years: 9.00    Types: Cigarettes   Smokeless tobacco: Current  Vaping Use   Vaping Use: Former  Substance and Sexual Activity   Alcohol use: Not Currently    Comment: rare   Drug use: Not Currently    Types: Marijuana   Sexual activity: Not on file  Other Topics Concern   Not on file  Social History Narrative   Not on file   Social Determinants of Health   Financial Resource Strain: Low Risk    Difficulty of Paying Living Expenses: Not hard at all  Food Insecurity: No Food Insecurity   Worried About Charity fundraiser in the Last Year: Never true   [place] in the Last Year: Never true  Transportation Needs: No Transportation Needs   Lack of Transportation (Medical): No   Lack of Transportation (Non-Medical): No   Physical Activity: Sufficiently Active   Days of Exercise per Week: 5 days   Minutes of Exercise per Session: 40 min  Stress: No Stress Concern Present   Feeling of Stress : Not at all  Social Connections: Moderately Isolated   Frequency of Communication with Friends and Family: More than three times a week   Frequency of Social Gatherings with Friends and Family: More than three times a week   Attends Religious Services: Never   Marine scientist or Organizations: No   Attends Music therapist: Never   Marital Status: Married  Human resources officer Violence: Not At Risk   Fear of Current or Ex-Partner: No   Emotionally Abused: No   Physically Abused: No   Sexually Abused: No   Social History   Tobacco Use  Smoking Status Former   Packs/day: 0.50   Years: 18.00   Pack years: 9.00   Types: Cigarettes  Smokeless Tobacco Current   Social History   Substance and Sexual Activity  Alcohol Use Not Currently   Comment: rare    Family History:  Family History  Problem Relation Age of Onset   Asthma Father    Diabetes Father    Cancer - Lung Father    Cancer Neg Hx    COPD Neg Hx    Heart disease Neg Hx    Hypertension Neg Hx    Stroke Neg Hx    Prostate cancer Neg Hx    Sickle cell trait Neg Hx     Past medical history, surgical history, medications, allergies, family history and social history reviewed with patient today and changes made to appropriate areas of the chart.   Review of Systems - negative All other ROS negative except what is listed above and in the HPI.      Objective:    BP 117/78   Pulse 77   Temp 98.1 F (36.7 C) (Oral)   Ht 6' (1.829 m)   Wt 213 lb (96.6 kg)   SpO2 98%   BMI 28.89 kg/m   Wt Readings from Last 3 Encounters:  02/12/21 213 lb (96.6 kg)  02/07/20 216 lb (98 kg)  01/24/20 213 lb (96.6 kg)    Physical Exam Vitals and nursing note reviewed.  Constitutional:      General: He is awake. He is not in acute  distress.    Appearance: He is well-developed and well-groomed. He is not ill-appearing.  HENT:     Head: Normocephalic and atraumatic.  Right Ear: Hearing, tympanic membrane, ear canal and external ear normal. No drainage.     Left Ear: Hearing, tympanic membrane, ear canal and external ear normal. No drainage.     Nose: Nose normal.     Mouth/Throat:     Pharynx: Uvula midline.  Eyes:     General: Lids are normal.        Right eye: No discharge.        Left eye: No discharge.     Extraocular Movements: Extraocular movements intact.     Conjunctiva/sclera: Conjunctivae normal.     Pupils: Pupils are equal, round, and reactive to light.     Visual Fields: Right eye visual fields normal and left eye visual fields normal.  Neck:     Thyroid: No thyromegaly.     Vascular: No carotid bruit or JVD.     Trachea: Trachea normal.  Cardiovascular:     Rate and Rhythm: Normal rate and regular rhythm.     Heart sounds: Normal heart sounds, S1 normal and S2 normal. No murmur heard.   No gallop.  Pulmonary:     Effort: Pulmonary effort is normal. No accessory muscle usage or respiratory distress.     Breath sounds: Normal breath sounds.  Abdominal:     General: Bowel sounds are normal.     Palpations: Abdomen is soft. There is no hepatomegaly or splenomegaly.     Tenderness: There is no abdominal tenderness.  Musculoskeletal:        General: Normal range of motion.     Cervical back: Normal range of motion and neck supple.     Right lower leg: No edema.     Left lower leg: No edema.  Lymphadenopathy:     Head:     Right side of head: No submental, submandibular, tonsillar, preauricular or posterior auricular adenopathy.     Left side of head: No submental, submandibular, tonsillar, preauricular or posterior auricular adenopathy.     Cervical: No cervical adenopathy.  Skin:    General: Skin is warm and dry.     Capillary Refill: Capillary refill takes less than 2 seconds.      Findings: No rash.     Comments: Multiple tattoos and piercing's present.  Neurological:     Mental Status: He is alert and oriented to person, place, and time.     Cranial Nerves: Cranial nerves are intact.     Gait: Gait is intact.     Deep Tendon Reflexes: Reflexes are normal and symmetric.     Reflex Scores:      Brachioradialis reflexes are 2+ on the right side and 2+ on the left side.      Patellar reflexes are 2+ on the right side and 2+ on the left side. Psychiatric:        Attention and Perception: Attention normal.        Mood and Affect: Mood normal.        Speech: Speech normal.        Behavior: Behavior normal. Behavior is cooperative.        Thought Content: Thought content normal.        Cognition and Memory: Cognition normal.        Judgment: Judgment normal.    Results for orders placed or performed in visit on 02/07/20  CBC with Differential/Platelet  Result Value Ref Range   WBC 7.0 3.4 - 10.8 x10E3/uL   RBC 4.41 4.14 - 5.80 x10E6/uL  Hemoglobin 14.0 13.0 - 17.7 g/dL   Hematocrit 41.7 37.5 - 51.0 %   MCV 95 79 - 97 fL   MCH 31.7 26.6 - 33.0 pg   MCHC 33.6 31.5 - 35.7 g/dL   RDW 12.3 11.6 - 15.4 %   Platelets 332 150 - 450 x10E3/uL   Neutrophils 68 Not Estab. %   Lymphs 24 Not Estab. %   Monocytes 6 Not Estab. %   Eos 1 Not Estab. %   Basos 1 Not Estab. %   Neutrophils Absolute 4.8 1.4 - 7.0 x10E3/uL   Lymphocytes Absolute 1.7 0.7 - 3.1 x10E3/uL   Monocytes Absolute 0.4 0.1 - 0.9 x10E3/uL   EOS (ABSOLUTE) 0.1 0.0 - 0.4 x10E3/uL   Basophils Absolute 0.0 0.0 - 0.2 x10E3/uL   Immature Granulocytes 0 Not Estab. %   Immature Grans (Abs) 0.0 0.0 - 0.1 x10E3/uL  Comprehensive metabolic panel  Result Value Ref Range   Glucose 92 65 - 99 mg/dL   BUN 15 6 - 20 mg/dL   Creatinine, Ser 1.06 0.76 - 1.27 mg/dL   GFR calc non Af Amer 90 >59 mL/min/1.73   GFR calc Af Amer 104 >59 mL/min/1.73   BUN/Creatinine Ratio 14 9 - 20   Sodium 140 134 - 144 mmol/L    Potassium 4.1 3.5 - 5.2 mmol/L   Chloride 101 96 - 106 mmol/L   CO2 21 20 - 29 mmol/L   Calcium 9.6 8.7 - 10.2 mg/dL   Total Protein 7.0 6.0 - 8.5 g/dL   Albumin 5.0 4.0 - 5.0 g/dL   Globulin, Total 2.0 1.5 - 4.5 g/dL   Albumin/Globulin Ratio 2.5 (H) 1.2 - 2.2   Bilirubin Total 0.4 0.0 - 1.2 mg/dL   Alkaline Phosphatase 59 44 - 121 IU/L   AST 18 0 - 40 IU/L   ALT 30 0 - 44 IU/L  Lipid Panel w/o Chol/HDL Ratio  Result Value Ref Range   Cholesterol, Total 237 (H) 100 - 199 mg/dL   Triglycerides 81 0 - 149 mg/dL   HDL 44 >39 mg/dL   VLDL Cholesterol Cal 14 5 - 40 mg/dL   LDL Chol Calc (NIH) 179 (H) 0 - 99 mg/dL  TSH  Result Value Ref Range   TSH 0.532 0.450 - 4.500 uIU/mL  Hepatitis C antibody  Result Value Ref Range   Hep C Virus Ab <0.1 0.0 - 0.9 s/co ratio      Assessment & Plan:   Problem List Items Addressed This Visit   None Visit Diagnoses     Right testicular pain    -  Primary   Referral to urology per request.   Relevant Orders   Ambulatory referral to Urology   Encounter for screening for cardiovascular disorders       Lipid panel today   Relevant Orders   Comprehensive metabolic panel   Lipid Panel w/o Chol/HDL Ratio   Encounter for annual physical exam       Annual physical with labs obtained.  Health maintenance reviewed.   Relevant Orders   CBC with Differential/Platelet   TSH       LABORATORY TESTING:  Health maintenance labs ordered today as discussed above.   IMMUNIZATIONS:   - Tdap: Tetanus vaccination status reviewed: last tetanus booster within 10 years. - Influenza: refuses - Pneumovax: Not applicable - Prevnar: Not applicable - Zostavax vaccine: Not applicable  SCREENING: - Colonoscopy: Not applicable  Discussed with patient purpose of the colonoscopy is to  detect colon cancer at curable precancerous or early stages   - AAA Screening: Not applicable  -Hearing Test: Not applicable  -Spirometry: Not applicable   PATIENT COUNSELING:     Sexuality: Discussed sexually transmitted diseases, partner selection, use of condoms, avoidance of unintended pregnancy  and contraceptive alternatives.   Advised to avoid cigarette smoking.  I discussed with the patient that most people either abstain from alcohol or drink within safe limits (<=14/week and <=4 drinks/occasion for males, <=7/weeks and <= 3 drinks/occasion for females) and that the risk for alcohol disorders and other health effects rises proportionally with the number of drinks per week and how often a drinker exceeds daily limits.  Discussed cessation/primary prevention of drug use and availability of treatment for abuse.   Diet: Encouraged to adjust caloric intake to maintain  or achieve ideal body weight, to reduce intake of dietary saturated fat and total fat, to limit sodium intake by avoiding high sodium foods and not adding table salt, and to maintain adequate dietary potassium and calcium preferably from fresh fruits, vegetables, and low-fat dairy products.    Stressed the importance of regular exercise  Injury prevention: Discussed safety belts, safety helmets, smoke detector, smoking near bedding or upholstery.   Dental health: Discussed importance of regular tooth brushing, flossing, and dental visits.   Follow up plan: NEXT PREVENTATIVE PHYSICAL DUE IN 1 YEAR. Return in about 1 year (around 02/12/2022).

## 2021-02-12 NOTE — Patient Instructions (Signed)

## 2021-02-13 LAB — CBC WITH DIFFERENTIAL/PLATELET
Basophils Absolute: 0.1 10*3/uL (ref 0.0–0.2)
Basos: 1 %
EOS (ABSOLUTE): 0.1 10*3/uL (ref 0.0–0.4)
Eos: 1 %
Hematocrit: 39.4 % (ref 37.5–51.0)
Hemoglobin: 14 g/dL (ref 13.0–17.7)
Immature Grans (Abs): 0 10*3/uL (ref 0.0–0.1)
Immature Granulocytes: 0 %
Lymphocytes Absolute: 1.6 10*3/uL (ref 0.7–3.1)
Lymphs: 29 %
MCH: 32.3 pg (ref 26.6–33.0)
MCHC: 35.5 g/dL (ref 31.5–35.7)
MCV: 91 fL (ref 79–97)
Monocytes Absolute: 0.4 10*3/uL (ref 0.1–0.9)
Monocytes: 8 %
Neutrophils Absolute: 3.4 10*3/uL (ref 1.4–7.0)
Neutrophils: 61 %
Platelets: 302 10*3/uL (ref 150–450)
RBC: 4.33 x10E6/uL (ref 4.14–5.80)
RDW: 12 % (ref 11.6–15.4)
WBC: 5.6 10*3/uL (ref 3.4–10.8)

## 2021-02-13 LAB — COMPREHENSIVE METABOLIC PANEL
ALT: 33 IU/L (ref 0–44)
AST: 21 IU/L (ref 0–40)
Albumin/Globulin Ratio: 2.3 — ABNORMAL HIGH (ref 1.2–2.2)
Albumin: 4.8 g/dL (ref 4.0–5.0)
Alkaline Phosphatase: 55 IU/L (ref 44–121)
BUN/Creatinine Ratio: 14 (ref 9–20)
BUN: 14 mg/dL (ref 6–20)
Bilirubin Total: 0.4 mg/dL (ref 0.0–1.2)
CO2: 21 mmol/L (ref 20–29)
Calcium: 9.8 mg/dL (ref 8.7–10.2)
Chloride: 101 mmol/L (ref 96–106)
Creatinine, Ser: 1.03 mg/dL (ref 0.76–1.27)
Globulin, Total: 2.1 g/dL (ref 1.5–4.5)
Glucose: 86 mg/dL (ref 70–99)
Potassium: 4.1 mmol/L (ref 3.5–5.2)
Sodium: 140 mmol/L (ref 134–144)
Total Protein: 6.9 g/dL (ref 6.0–8.5)
eGFR: 96 mL/min/{1.73_m2} (ref >59)

## 2021-02-13 LAB — LIPID PANEL W/O CHOL/HDL RATIO
Cholesterol, Total: 231 mg/dL — ABNORMAL HIGH (ref 100–199)
HDL: 43 mg/dL (ref >39)
LDL Chol Calc (NIH): 177 mg/dL — ABNORMAL HIGH (ref 0–99)
Triglycerides: 64 mg/dL (ref 0–149)
VLDL Cholesterol Cal: 11 mg/dL (ref 5–40)

## 2021-02-13 LAB — TSH: TSH: 0.674 u[IU]/mL (ref 0.450–4.500)

## 2021-02-14 NOTE — Progress Notes (Signed)
Contacted via [place] [facility] Mort, your labs have returned and overall look great with exception of cholesterol levels.  Your cholesterol is still high, but continued recommendations to make lifestyle changes. Your LDL is above normal. The LDL is the bad cholesterol. Over time and in combination with inflammation and other factors, this contributes to plaque which in turn may lead to stroke and/or heart attack down the road. Sometimes high LDL is primarily genetic, and people might be eating all the right foods but still have high numbers. Other times, there is room for improvement in one's diet and eating healthier can bring this number down and potentially reduce one's risk of heart attack and/or stroke.   To reduce your LDL, Remember - more fruits and vegetables, more fish, and limit red meat and dairy products. More soy, nuts, beans, barley, lentils, oats and plant sterol ester enriched margarine instead of butter. I also encourage eliminating sugar and processed food. Remember, shop on the outside of the grocery store and visit your [employer]. If you would like to talk with me about dietary changes plus or minus medications for your cholesterol, please let me know. We should recheck your cholesterol in 12 months.  Any questions? Keep being awesome!!  Thank you for allowing me to participate in your care.  I appreciate you. Kindest regards, Emannuel Vise

## 2021-02-25 ENCOUNTER — Other Ambulatory Visit: Payer: Self-pay

## 2021-02-25 DIAGNOSIS — N50811 Right testicular pain: Secondary | ICD-10-CM

## 2021-02-26 ENCOUNTER — Encounter: Payer: Self-pay | Admitting: Urology

## 2021-02-26 ENCOUNTER — Ambulatory Visit: Payer: Managed Care, Other (non HMO) | Admitting: Urology

## 2021-02-26 ENCOUNTER — Other Ambulatory Visit: Payer: Self-pay

## 2021-02-26 ENCOUNTER — Other Ambulatory Visit
Admission: RE | Admit: 2021-02-26 | Discharge: 2021-02-26 | Disposition: A | Discharge status: Home / Self Care | Payer: Managed Care, Other (non HMO) | Attending: Urology | Admitting: Urology

## 2021-02-26 VITALS — BP 138/78 | HR 80 | Ht 73.0 in | Wt 213.0 lb

## 2021-02-26 DIAGNOSIS — M6289 Other specified disorders of muscle: Secondary | ICD-10-CM

## 2021-02-26 DIAGNOSIS — N50811 Right testicular pain: Secondary | ICD-10-CM | POA: Diagnosis present

## 2021-02-26 LAB — URINALYSIS, COMPLETE (UACMP) WITH MICROSCOPIC
Bacteria, UA: NONE SEEN
Bilirubin Urine: NEGATIVE
Glucose, UA: NEGATIVE mg/dL
Hgb urine dipstick: NEGATIVE
Ketones, ur: NEGATIVE mg/dL
Leukocytes,Ua: NEGATIVE
Nitrite: NEGATIVE
Protein, ur: NEGATIVE mg/dL
Specific Gravity, Urine: 1.025 (ref 1.005–1.030)
pH: 5.5 (ref 5.0–8.0)

## 2021-02-26 NOTE — Progress Notes (Signed)
02/26/2021 2:37 PM   Thomas Sawyer 05-18-1985 [id number]  Referring provider: Venita Lick, NP [address] [place],  [place] [address]  Chief Complaint  Patient presents with   Testicle Pain    New Patient    HPI: 35 year old male who presents today with intermittent right scrotal pain and urinary symptoms.  He reports that he has had intermittent sharp sudden right testicular pain ongoing for many years.  He describes several situations including when he is at work or particular situation when he is speeding on the highway and suddenly sees a pop as triggers for these episodes of sudden and brief severe right testicular pain.  These often tend to be in a higher anxiety at times as well.  He also has a personal history of urinary intermittency and occasional burning with urination.  Some of this was exacerbated while being in prison when he was forced to urinate quickly which made the situation worse.  He does have a personal history of chronic right testicular pain.  He saw Baruch Gouty for the same issue back in 2017.  She underwent imaging for the same issue back in 2020 in the form of a scrotal ultrasound which showed an incidental small left varicocele otherwise was unremarkable.  PMH: Past Medical History:  Diagnosis Date   ADHD (attention deficit hyperactivity disorder)    Anxiety    Depression    Depression    Genital warts    Hiatal hernia    Oppositional defiant behavior     Surgical History: Past Surgical History:  Procedure Laterality Date   BRAIN SURGERY  05/02/2009   tumor removal    Home Medications:  Allergies as of 02/26/2021   No Known Allergies      Medication List        Accurate as of February 26, 2021  2:37 PM. If you have any questions, ask your nurse or doctor.          pantoprazole 40 MG tablet Commonly known as: PROTONIX Take by mouth.   valACYclovir 1000 MG tablet Commonly known as: VALTREX Take 1 tablet (1,000 mg total)  by mouth 2 (two) times daily as needed.        Allergies: No Known Allergies  Family History: Family History  Problem Relation Age of Onset   Asthma Father    Diabetes Father    Cancer - Lung Father    Cancer Neg Hx    COPD Neg Hx    Heart disease Neg Hx    Hypertension Neg Hx    Stroke Neg Hx    Prostate cancer Neg Hx    Sickle cell trait Neg Hx    Bladder Cancer Neg Hx    Kidney cancer Neg Hx     Social History:  reports that he has quit smoking. His smoking use included cigarettes. He has a 9.00 pack-year smoking history. His smokeless tobacco use includes snuff. He reports that he does not currently use alcohol. He reports that he does not currently use drugs after having used the following drugs: Marijuana.   Physical Exam: BP 138/78   Pulse 80   Ht 6\' 1"  (1.854 m)   Wt 213 lb (96.6 kg)   BMI 28.10 kg/m   Constitutional:  Alert and oriented, No acute distress. HEENT: [place] AT, moist mucus membranes.  Trachea midline, no masses. Cardiovascular: No clubbing, cyanosis, or edema. Respiratory: Normal respiratory effort, no increased work of breathing. GI: Abdomen is soft,  nontender, nondistended, no abdominal masses GU: Normal phallus without discharge orthotopic meatus.  Bilateral descended testicles without masses or tumors.  Normal exam. Skin: No rashes, bruises or suspicious lesions. Neurologic: Grossly intact, no focal deficits, moving all 4 extremities. Psychiatric: Normal mood and affect.  Laboratory Data: Lab Results  Component Value Date   WBC 5.6 02/12/2021   HGB 14.0 02/12/2021   HCT 39.4 02/12/2021   MCV 91 02/12/2021   PLT 302 02/12/2021    Lab Results  Component Value Date   CREATININE 1.03 02/12/2021    Urinalysis    Component Value Date/Time   COLORURINE YELLOW 02/26/2021 [place] 02/26/2021 1355   APPEARANCEUR Clear 12/28/2018 1445   LABSPEC 1.025 02/26/2021 1355   PHURINE 5.5 02/26/2021 [place]  02/26/2021 1355   HGBUR NEGATIVE 02/26/2021 [place] 02/26/2021 1355   BILIRUBINUR Negative 12/28/2018 [place] 02/26/2021 1355   PROTEINUR NEGATIVE 02/26/2021 1355   NITRITE NEGATIVE 02/26/2021 [place] 02/26/2021 1355    Lab Results  Component Value Date   LABMICR See below: 12/22/2017   WBCUA 0-5 12/22/2017   RBCUA None seen 12/22/2017   LABEPIT 0-10 12/22/2017   BACTERIA NONE SEEN 02/26/2021    Assessment & Plan:    1. Pain in right testicle Suspect this may be related to underlying pelvic floor dysfunction, see below  Physical exam today is completely unremarkable has had previous imaging and exams in the past.  He was reassured.  He was anxious about cancer I see no indication that he has any anatomic pathology.  We discussed testicular pain as well as how it relates to pelvic floor dysfunction abdominal wall pathology. - Ambulatory referral to Physical Therapy  2. Pelvic floor dysfunction Urinary symptoms are most consistent with pelvic floor dysfunction  Urinalysis unremarkable  Provided with literature about the underlying condition and is quite certain that this is his issue.  He is agreeable for pelvic floor therapy. - Ambulatory referral to Physical Therapy  Hollice Espy, MD  [place] [address] [place],  [address] [phone]

## 2021-04-02 ENCOUNTER — Other Ambulatory Visit: Payer: Self-pay | Admitting: Nurse Practitioner

## 2021-04-02 NOTE — Telephone Encounter (Signed)
Pt is having a breakout and is completely out.  He would like something asap.

## 2021-04-02 NOTE — Telephone Encounter (Signed)
Medication Refill - Medication: Valtrex  1000 mg   Has the patient contacted their pharmacy? No. [facility] a new rx (Agent: If no, request that the patient contact the pharmacy for the refill. If patient does not wish to contact the pharmacy document the reason why and proceed with request.) (Agent: If yes, when and what did the pharmacy advise?)  Preferred Pharmacy (with phone number or street name): [address] Has the patient been seen for an appointment in the last year OR does the patient have an upcoming appointment? Yes.    Agent: Please be advised that RX refills may take up to 3 business days. We ask that you follow-up with your pharmacy.

## 2021-04-03 MED ORDER — VALACYCLOVIR HCL 1 G PO TABS
1000.0000 mg | ORAL_TABLET | Freq: Two times a day (BID) | ORAL | 3 refills | Status: DC | PRN
Start: 2021-04-03 — End: 2023-01-16

## 2021-04-03 NOTE — Telephone Encounter (Signed)
Requested Prescriptions  Pending Prescriptions Disp Refills  . valACYclovir (VALTREX) 1000 MG tablet 20 tablet 3    Sig: Take 1 tablet (1,000 mg total) by mouth 2 (two) times daily as needed.     Antimicrobials:  Antiviral Agents - Anti-Herpetic Passed - 04/02/2021 11:19 AM      Passed - Valid encounter within last 12 months    Recent Outpatient Visits          1 month ago Right testicular pain   [place], Henrine Screws T, NP   1 year ago Routine general medical examination at a health care facility   [place], Henrine Screws T, NP   2 years ago COVID-19   [place], [place], DO   2 years ago Right testicular pain   [place], [place]   3 years ago Viral upper respiratory tract infection   [facility] [place], [place]      Future Appointments            In 10 months Cann[place], Barbaraann Faster, NP [employer]

## 2021-04-14 ENCOUNTER — Ambulatory Visit: Payer: Managed Care, Other (non HMO) | Attending: Urology | Admitting: Physical Therapy

## 2021-04-14 ENCOUNTER — Encounter: Payer: Self-pay | Admitting: Physical Therapy

## 2021-04-14 ENCOUNTER — Other Ambulatory Visit: Payer: Self-pay

## 2021-04-14 DIAGNOSIS — N50811 Right testicular pain: Secondary | ICD-10-CM | POA: Insufficient documentation

## 2021-04-14 DIAGNOSIS — M5441 Lumbago with sciatica, right side: Secondary | ICD-10-CM | POA: Insufficient documentation

## 2021-04-14 DIAGNOSIS — G8929 Other chronic pain: Secondary | ICD-10-CM | POA: Insufficient documentation

## 2021-04-14 DIAGNOSIS — M5442 Lumbago with sciatica, left side: Secondary | ICD-10-CM | POA: Diagnosis present

## 2021-04-14 DIAGNOSIS — R2689 Other abnormalities of gait and mobility: Secondary | ICD-10-CM | POA: Diagnosis present

## 2021-04-14 DIAGNOSIS — M533 Sacrococcygeal disorders, not elsewhere classified: Secondary | ICD-10-CM | POA: Insufficient documentation

## 2021-04-14 DIAGNOSIS — R278 Other lack of coordination: Secondary | ICD-10-CM | POA: Insufficient documentation

## 2021-04-14 DIAGNOSIS — M6289 Other specified disorders of muscle: Secondary | ICD-10-CM | POA: Insufficient documentation

## 2021-04-14 NOTE — Patient Instructions (Signed)
Open book ( handout) to realign midback and promote more diaphragm function for pelvic floor function Pillow between knees and behind back   ___  Clam Shell 45 Degrees to strengthen hips   Lying with hips and knees bent 45, one pillow between knees and ankles. Heel together, toes apart like ballerina,  Lift knee with exhale while pressing heels together. Be sure pelvis does not roll backward. Do not arch back. Do 20 times, each leg, 2 times per day.     Complimentary stretch: Figure-4  seated,  Cross _ foot over _ thigh, opposite knee straight  3 breaths   ___  BOdy mechanics:  1) Proper body mechanics with getting out of a chair to decrease strain  on back &pelvic floor   Avoid holding your breath when Getting out of the chair:  Scoot to front part of chair chair Heels behind knees, feet are hip width apart, nose over toes  Inhale like you are smelling roses Exhale to stand    2) Avoid straining pelvic floor, abdominal muscles , spine  Use log rolling technique instead of getting out of bed with your neck or the sit-up     Log rolling into and out of bed   Log rolling into and out of bed If getting out of bed on R side, Bent knees, scoot hips/ shoulder to L  Raise R arm completely overhead, rolling onto armpit  Then lower bent knees to bed to get into complete side lying position  Then drop legs off bed, and push up onto R elbow/forearm, and use L hand to push onto the bed   3) When standing to pee, inhale softly for diaphragm expansion, pelvic floor will relax . When finishing urination, dont push stomach out, , breathe, exhale, quick pelvic floor

## 2021-04-15 NOTE — Therapy (Signed)
[place] [facility] SERVICES [address] [place], [place], [address] Phone: [phone]   Fax:  [phone]  Physical Therapy Evaluation  Patient Details  Name: Thomas Sawyer MRN: [id number] Date of Birth: 06-14-1985 Referring Provider (PT): Erlene Quan         MD   Encounter Date: 04/14/2021   PT End of Session - 04/15/21 1012     Visit Number 1    Number of Visits 10    Date for PT Re-Evaluation 06/24/21    PT Start Time 1700    PT Stop Time 1749    PT Time Calculation (min) 86 min    Activity Tolerance Patient tolerated treatment well;No increased pain    Behavior During Therapy WFL for tasks assessed/performed             Past Medical History:  Diagnosis Date   ADHD (attention deficit hyperactivity disorder)    Anxiety    Depression    Depression    Genital warts    Hiatal hernia    Oppositional defiant behavior     Past Surgical History:  Procedure Laterality Date   BRAIN SURGERY  05/02/2009   tumor removal    There were no vitals filed for this visit.    Subjective Assessment - 04/14/21 1426     Subjective 1) Pelvic pain and urinary issues: Pt had testicular pain had more than 5 years ago. Pt would go get STI tests but they turned out neg but the pain remained.  Most of the time, the R testicles hurt but he feels pain in both. Pain came on suddenly with at testicles and low abdomen on R. When it began, it was 3/10 and currently,  he feels it at 8/10 at its worst. One time it started, he is driving 75  mph and he see a state trooper and his body automatically tensed up and then the testicle draws up . The pain was a 7/10 and the pain lingered for 15 min .  It felt "weird" in his low abdomen right and left at times. Pain occurs after ejaculation if pt had not had sex for a few days. Pain occurs with  urination  60% of the time. with burning sensation. Pt notices it takes time to start urine flow. Anytime he had to pee when  other people are around, he  had a hard time. Pt has to go into a stall and be able to shut the door. When he was in prison, pt had to pee in  front of someone and he was not able to. Now it takes a couple of minutes for him to pee . The other day at church, he was at a urinal and someone walked by and he froze up and could not pee until the person left.  Pt also has to shake to completely finish urination. Sometimes it does not feel complete and he experience dribbling down his leg. Pt is trying to take care of his body the past 4 years and has come clean off of taking drugs,  alchohol. Pt has a wife and kids ( 17 years old , 68 months old)    2) Straining with bowel movements:  Pt avoids using public restrooms for bowel movements. Pt has daily movements and sometimes strain.  Hx of hemorrhoids   3) radiating LBP: Pt has had this radiating LBP for 10+ years. It started when working at [employer] and had to get  in wierd positions. Currently, LBP 8/10 with radiating pain to calf on R LE. Every once in a while he gets pain in LLE. Pain occurs with long periods of sitting, driving, standing.      Pertinent History Pt used to do sit ups and crunches but not currently. Pt is welder and uses forklifts to lift. Pt tries to make it easier for himself to not lift and bend as much as he can because he has had low back pain and R shoulder pain. Hx of fall onto buttocks from a tramopline  when he was younger    Patient Stated Goals To  feel  relief and understand his body                [facility] PT Assessment - 04/14/21 1455       Assessment   Medical Diagnosis Pelvic pain    Referring Provider (PT) Erlene Quan         MD      Precautions   Precautions None      Restrictions   Weight Bearing Restrictions No      Balance Screen   Has the patient fallen in the past 6 months No      Observation/Other Assessments   Observations slumped      Sit to Stand   Comments breathholding      AROM   Overall  AROM Comments WFL lumbar movements, did not reprodue LBP      Strength   Overall Strength Comments B hip abd 3/5 , hip flex/knee flex/ ext 5/5      Palpation   Spinal mobility T/J junction convex L, R convex at Thoracic    SI assessment  L   iliac crest higher/ R shoulder slightly higher, ( post Tx: levelled pelvic girdle)      Bed Mobility   Bed Mobility --   cit up method     Ambulation/Gait   Gait Comments R trunk lean, decreased R stance phase,  (postT x :R trunk lean still present butmore B WBing BLE)                        Objective measurements completed on examination: See above findings.     Pelvic Floor Special Questions - 04/14/21 1502     Diastasis Recti neg    External Perineal Exam through clothing: ab straining with cue for contraction, inhalation with chest breathing              [facility] Adult PT Treatment/Exercise - 04/15/21 1052       Bed Mobility   Bed Mobility --   cit up method     Ambulation/Gait   Gait Comments R trunk lean, decreased R stance phase,  (postT x :R trunk lean still present butmore B WBing BLE)      Therapeutic Activites    Other Therapeutic Activities cued for body mechanics and explained impact of posture and position changes to minimize straining pelvic floor and proper breathing technique with pelvic floor contraction to minimize straining with urination      Neuro Re-ed    Neuro Re-ed Details  cued for HEP, calam shells, fiure 4 stretch , and thoracic rotation stretch      Manual Therapy   Manual therapy comments STM/MWM at problem areas noted in thoracic spine to promote less asymmetry at iliac rest height and convex curves of spine  PT Long Term Goals - 04/15/21 1043       PT LONG TERM GOAL #1   Title Pt will demo  proper deep core coordination without chest breathing and optimal excursion of diaphragm/pelvic floor to minimize pelvic pain and urinary Sx    Baseline  chest breathing    Time 3    Period Weeks    Status New    Target Date 05/06/21      PT LONG TERM GOAL #2   Title Pt will demo proper alignment and technique with sitting, logrolling, sit to stand, lifting, body mechanics with ADLs to minimize straining of abdominopelvic floor mm and spine.    Baseline straining techniques, slumping in sitting    Time 2    Period Weeks    Status New    Target Date 04/29/21      PT LONG TERM GOAL #3   Title Pt will demo equal alignment of pelvic girdle and less convex curves along spine in order to progress to deep core HEP and minimize LBP and optimize pelvic floor function    Baseline L iliac crest higher , R thoracic, L lumbar convex curve    Time 6    Period Weeks    Status New    Target Date 05/27/21      PT LONG TERM GOAL #4   Title Pt will report no radiating LBP with driving, sitting, standing    Baseline radiating LBP with driving, sitting, standing    Time 10    Period Weeks    Status New    Target Date 06/24/21      PT LONG TERM GOAL #5   Title Pt will improve FOTO Lumbar score from 70  pts to > 75 pts in order to improve ADLs    Baseline 70 pts    Time 10    Period Weeks    Status New    Target Date 06/24/21      Additional Long Term Goals   Additional Long Term Goals Yes      PT LONG TERM GOAL #6   Title Pt will improve Urinary Problem score on FOTO from 56 pts to > 66 pts in order to urinate without delay and complete urination without pain    Time 10    Period Weeks    Status New    Target Date 06/24/21                    Plan - 04/15/21 1013     Clinical Impression Statement  Pt is a 35 yo who presents with pelvic pain, urinary issues, straining with bowel movement, and radiating CLBP which impact QOL and IAP.  Pt's musculoskeletal assessment revealed uneven iliac crest ( L higher than R), T/J junction convex L, R convex at Thoracic, R shoulder higher, gait deviations,  dyscoordination and strength of  pelvic floor mm, B hip abduction weakness, and poor body mechanics which places strain on the abdominal/pelvic floor mm.   These are deficits that indicate an ineffective intraabdominal pressure system associated with increased risk for pt's Sx.    Pt was provided education on etiology of Sx with anatomy, physiology explanation with images along with the benefits of customized pelvic PT Tx based on pt's medical conditions and musculoskeletal deficits.  Explained the physiology of deep core mm coordination and roles of pelvic floor function in urination, defecation, sexual function, and postural control with deep core mm system.  Pt would benefit from a biopsychosocial approach to yield optimal outcomes. Explained today the role of nn system on urinary system and pelvic floor. Provided pain science education.   Following Tx today which pt tolerated without complaints, pt demo'd equal alignment of pelvic girdle. Provided cues for hip abduction strengthening and thoracic rotation HEP on technique.  Anticipate today's improvements will help minimize LBP and to optimize IAP system for urinary issues and pelvic pain. Provided cues for body mechanics and utilizing breath to minimize downward straining of pelvic floor with completing urination. Pt demo'd correctly after training. Plan to further address spinal deviations and progress to deep core HEP at next session.      Examination-Activity Limitations Toileting;Lift;Sit;Stairs    Stability/Clinical Decision Making Evolving/Moderate complexity    Clinical Decision Making Moderate    Rehab Potential Good    PT Frequency 1x / week    PT Duration Other (comment)   10   PT Treatment/Interventions Neuromuscular re-education;Balance training;Therapeutic exercise;Stair training;Functional mobility training;Gait training;Cryotherapy;Patient/family education;Therapeutic activities;Manual lymph drainage;Manual techniques;Moist Heat;ADLs/Self Care Home  Management;Traction;Joint Manipulations;Passive range of motion;Energy conservation    Consulted and Agree with Plan of Care Patient             Patient will benefit from skilled therapeutic intervention in order to improve the following deficits and impairments:  Decreased activity tolerance, Decreased balance, Decreased coordination, Postural dysfunction, Improper body mechanics, Increased muscle spasms, Decreased mobility, Decreased strength, Abnormal gait, Decreased endurance, Impaired sensation, Hypomobility, Decreased scar mobility, Pain, Decreased range of motion, Difficulty walking, Impaired flexibility  Visit Diagnosis: Sacrococcygeal disorders, not elsewhere classified  Other abnormalities of gait and mobility  Other lack of coordination  Pelvic floor dysfunction  Chronic bilateral low back pain with bilateral sciatica     Problem List Patient Active Problem List   Diagnosis Date Noted   Gastroesophageal reflux disease 05/14/2020   Overweight (BMI 25.0-29.9) 02/07/2020   HSV (herpes simplex virus) infection 12/27/2017   Hx of substance abuse ([place]) 11/13/2017   Insomnia 11/08/2017   Nicotine dependence, cigarettes, uncomplicated 08/81/1031   Genital warts    History of ADHD     Jerl Mina, PT 04/15/2021, 11:24 AM  [facility] [address] [place], [place], [address] Phone: [phone]   Fax:  [phone]  Name: Thomas Sawyer MRN: [id number] Date of Birth: 09-09-1985

## 2021-04-16 ENCOUNTER — Other Ambulatory Visit: Payer: Self-pay

## 2021-04-16 ENCOUNTER — Ambulatory Visit: Payer: Managed Care, Other (non HMO) | Admitting: Physical Therapy

## 2021-04-16 DIAGNOSIS — M533 Sacrococcygeal disorders, not elsewhere classified: Secondary | ICD-10-CM

## 2021-04-16 DIAGNOSIS — R2689 Other abnormalities of gait and mobility: Secondary | ICD-10-CM

## 2021-04-16 DIAGNOSIS — G8929 Other chronic pain: Secondary | ICD-10-CM

## 2021-04-16 DIAGNOSIS — M5441 Lumbago with sciatica, right side: Secondary | ICD-10-CM

## 2021-04-16 DIAGNOSIS — R278 Other lack of coordination: Secondary | ICD-10-CM

## 2021-04-16 DIAGNOSIS — M6289 Other specified disorders of muscle: Secondary | ICD-10-CM

## 2021-04-16 NOTE — Therapy (Signed)
[place] [facility] SERVICES [address] [place], [place], [address] Phone: [phone]   Fax:  [phone]  Physical Therapy Treatment  Patient Details  Name: Thomas Sawyer MRN: [id number] Date of Birth: 09/29/1985 Referring Provider (PT): Erlene Quan         MD   Encounter Date: 04/16/2021   PT End of Session - 04/16/21 1130     Visit Number 2    Number of Visits 10    Date for PT Re-Evaluation 06/24/21    PT Start Time 1100    PT Stop Time 1200    PT Time Calculation (min) 60 min    Activity Tolerance Patient tolerated treatment well;No increased pain    Behavior During Therapy WFL for tasks assessed/performed             Past Medical History:  Diagnosis Date   ADHD (attention deficit hyperactivity disorder)    Anxiety    Depression    Depression    Genital warts    Hiatal hernia    Oppositional defiant behavior     Past Surgical History:  Procedure Laterality Date   BRAIN SURGERY  05/02/2009   tumor removal    There were no vitals filed for this visit.   Subjective Assessment - 04/16/21 1130     Subjective Pt had no complaints after last session. 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    Pertinent History Pt used to do sit ups and crunches but not currently. 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 H x o ffall onto buttocks from a tramopline  when he was younger    Patient Stated Goals To  feel  relief and understand his body                [facility] PT Assessment - 04/16/21 1202       Coordination   Coordination and Movement Description poor diassociation of trunk/pelvis in multifidis twist      Palpation   Spinal mobility tightness at paraspinals,, hypomobile and tender at T 7     SI assessment  levelled pelvic girdle and shoulders                           [facility] Adult PT Treatment/Exercise - 04/16/21 1133       Therapeutic Activites    Other Therapeutic Activities body mechanics at work to minimize risk for LBP, and complimentary stretches      Neuro Re-ed    Neuro Re-ed Details  Cued for multifidis twist, diassociation of trunk and pelvis propioception training,      Exercises   Other Exercises  reviewed last session's HEP      Modalities   Modalities Moist Heat      Moist Heat Therapy   Number Minutes Moist Heat 5 Minutes    Moist Heat Location --   R back/ thoracic, in upper trunk rotation with support o f props, explained work activiy modification     Manual Therapy   Manual therapy comments STM/MWM at problem areas noted  in assesment to promote mobility at T 7  and increase  R trunk rotation                          PT Long Term Goals - 04/15/21 1043       PT LONG TERM GOAL #1   Title Pt will demo  proper deep core coordination without chest breathing and optimal excursion of diaphragm/pelvic floor to minimize pelvic pain and urinary Sx    Baseline chest breathing    Time 3    Period Weeks    Status New    Target Date 05/06/21      PT LONG TERM GOAL #2   Title Pt will demo proper alignment and technique with sitting, logrolling, sit to stand, lifting, body mechanics with ADLs to minimize straining of abdominopelvic floor mm and spine.    Baseline straining techniques, slumping in sitting    Time 2    Period Weeks    Status New    Target Date 04/29/21      PT LONG TERM GOAL #3   Title Pt will demo equal alignment of pelvic girdle and less convex curves along spine in order to progress to deep core HEP and minimize LBP and optimize pelvic floor function    Baseline L iliac crest higher , R thoracic, L lumbar convex curve    Time 6    Period Weeks    Status New    Target Date 05/27/21      PT LONG TERM  GOAL #4   Title Pt will report no radiating LBP with driving, sitting, standing    Baseline radiating LBP with driving, sitting, standing    Time 10    Period Weeks    Status New    Target Date 06/24/21      PT LONG TERM GOAL #5   Title Pt will improve FOTO Lumbar score from 70  pts to > 75 pts in order to improve ADLs    Baseline 70 pts    Time 10    Period Weeks    Status New    Target Date 06/24/21      Additional Long Term Goals   Additional Long Term Goals Yes      PT LONG TERM GOAL #6   Title Pt will improve Urinary Problem score on FOTO from 56 pts to > 66 pts in order to urinate without delay and complete urination without pain    Time 10    Period Weeks    Status New    Target Date 06/24/21                   Plan - 04/16/21 1203     Clinical Impression Statement Pt demo'd good carry over with levelled pelvic girdle and shoulders but still required manual Tx to mobility T7 and increase R trunk rotation. Pt demo'd improved mobility post Tx and was educated on complimentary stretches to his repeated motion at work which involves L trunk rotation and probably related to his initial presentation of scoliotic curves and uneven pelvic girdle height. Provided lifting and twisting mechanics to minimize risk for LBP.  Advanced to multifidis twist with cues for diassociation  between trunk and pelvis. Pt performed technique correctly in seated  and standing position after cues. Pt continues to benefit from skilled PT.  Today's session was scheduled as 2nd apt within a week when pt's POC is for 1 x week. This reason is because he has very limited  availability and therapist had an opening to fit him in. Working to schedule pt in the month of January based on cancellations because  t herapist schedule is already full at the times he needs. Pt is scheduled in Feb 1 x week. Pt continues to benefit from skilled PT.    Examination-Activity Limitations Toileting;Lift;Sit;Stairs     Stability/Clinical Decision Making Evolving/Moderate complexity    Rehab Potential Good    PT Frequency 1x / week    PT Duration Other (comment)   10   PT Treatment/Interventions Neuromuscular re-education;Balance training;Therapeutic exercise;Stair training;Functional mobility training;Gait training;Cryotherapy;Patient/family education;Therapeutic activities;Manual lymph drainage;Manual techniques;Moist Heat;ADLs/Self Care Home Management;Traction;Joint Manipulations;Passive range of motion;Energy conservation    Consulted and Agree with Plan of Care Patient             Patient will benefit from skilled therapeutic intervention in order to improve the following deficits and impairments:  Decreased activity tolerance, Decreased balance, Decreased coordination, Postural dysfunction, Improper body mechanics, Increased muscle spasms, Decreased mobility, Decreased strength, Abnormal gait, Decreased endurance, Impaired sensation, Hypomobility, Decreased scar mobility, Pain, Decreased range of motion, Difficulty walking, Impaired flexibility  Visit Diagnosis: Sacrococcygeal disorders, not elsewhere classified  Other lack of coordination  Other abnormalities of gait and mobility  Pelvic floor dysfunction  Chronic bilateral low back pain with bilateral sciatica     Problem List Patient Active Problem List   Diagnosis Date Noted   Gastroesophageal reflux disease 05/14/2020   Overweight (BMI 25.0-29.9) 02/07/2020   HSV (herpes simplex virus) infection 12/27/2017   Hx of substance abuse ([place]) 11/13/2017   Insomnia 11/08/2017   Nicotine dependence, cigarettes, uncomplicated 54/12/8117   Genital warts    History of ADHD     Jerl Mina, PT 04/16/2021, 12:05 PM  [place] [facility] SERVICES [address] [place], [place], [address] Phone: [phone]   Fax:  [phone]  Name: DANIAL HLAVAC MRN: [id number] Date of Birth:  05-06-1985

## 2021-04-16 NOTE — Patient Instructions (Signed)
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     Lengthen Back rib by R  shoulder    Lie on L  side , pillow between knees and under head  Pull R  arm overhead over mattress, grab the edge of mattress,pull it upward, drawing elbow away from ears  Breathing 10 reps  Open book (handout)  Lying on  _ side , rotating  __ only this week  Rotating onto pillow /yoga block  Pillow/ Block between knees  10 reps  REMEMBER TO LIFT FEET UP, PIVOT on L ballmound not Heel  AND THEN TURN whole body to prevent low back issues   __  TO STRENGTHEN LOW BACK   Multifidis twist  Band is on doorknob: stand further away from door (facing perpendicular)   Twisting trunk without moving the hips and knees Hold band at the level of ribcage, elbows bent,shoulder blades roll back and down like squeezing a pencil under armpit    Exhale twist,.10-15 deg away from door without moving your hips/ knees. Continue to maintain equal weight through legs. Keep knee unlocked.  10 x 2

## 2021-04-20 ENCOUNTER — Encounter: Payer: Managed Care, Other (non HMO) | Admitting: Physical Therapy

## 2021-04-23 ENCOUNTER — Other Ambulatory Visit: Payer: Self-pay

## 2021-04-23 ENCOUNTER — Ambulatory Visit: Payer: Managed Care, Other (non HMO) | Admitting: Physical Therapy

## 2021-04-23 DIAGNOSIS — M533 Sacrococcygeal disorders, not elsewhere classified: Secondary | ICD-10-CM | POA: Diagnosis not present

## 2021-04-23 DIAGNOSIS — M5442 Lumbago with sciatica, left side: Secondary | ICD-10-CM

## 2021-04-23 DIAGNOSIS — R278 Other lack of coordination: Secondary | ICD-10-CM

## 2021-04-23 DIAGNOSIS — R2689 Other abnormalities of gait and mobility: Secondary | ICD-10-CM

## 2021-04-23 DIAGNOSIS — M6289 Other specified disorders of muscle: Secondary | ICD-10-CM

## 2021-04-23 NOTE — Patient Instructions (Addendum)
Deep core level 1-2 ( HEP)  __   Log rolling out of bed    ___  With multifidis twist  - inhale,  -exhale with gentle lift of pelvic floor and inward movement of low belly instead of excessive oblique muscles by the rib pushing belly out    Standing with soft knees, center of mass ( navel) is more forward at the line of ballmounds soweight is 50% at the ballmounds and 50% at   Heels    ___   Lifting on exhale    ---  when standing to urinate:  Standing with soft knees, center of mass ( navel) is more forward at the line of ballmounds soweight is 50% at the ballmounds and 50% at   Heels

## 2021-04-23 NOTE — Therapy (Signed)
[place] MAIN [facility] SERVICES [address] [place], [place], [address] Phone: [phone]   Fax:  [phone]  Physical Therapy Treatment  Patient Details  Name: Thomas Sawyer MRN: [id number] Date of Birth: January 03, 1986 Referring Provider (PT): Erlene Quan         MD   Encounter Date: 04/23/2021   PT End of Session - 04/23/21 1049     Visit Number 3    Number of Visits 10    Date for PT Re-Evaluation 06/24/21    PT Start Time 1050    PT Stop Time 1146    PT Time Calculation (min) 56 min    Activity Tolerance Patient tolerated treatment well;No increased pain    Behavior During Therapy WFL for tasks assessed/performed             Past Medical History:  Diagnosis Date   ADHD (attention deficit hyperactivity disorder)    Anxiety    Depression    Depression    Genital warts    Hiatal hernia    Oppositional defiant behavior     Past Surgical History:  Procedure Laterality Date   BRAIN SURGERY  05/02/2009   tumor removal    There were no vitals filed for this visit.   Subjective Assessment - 04/23/21 1056     Subjective Pt is off work since Tuesday.    Pertinent History Pt used to do sit ups and crunches but not currently. 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 H x o ffall onto buttocks from a tramopline  when he was younger    Patient Stated Goals To  feel  relief and understand his body                [facility] PT Assessment - 04/23/21 1122       Coordination   Coordination and Movement Description ab straining prior to twist in multifdis twist      Squat   Comments pooralignment of knees      Palpation   Spinal mobility hypomobile T 10- L1 , tightness at L paraspinal mm , lower trap, posterior lower intercostals L      Bed Mobility   Bed Mobility --   crunch method,                           [facility] Adult PT Treatment/Exercise - 04/23/21 1122       Therapeutic Activites    Other Therapeutic Activities cued for log rolling, cued for anterior COM when standing to urinate      Neuro Re-ed    Neuro Re-ed Details  cued for deep core HEP, cued for less ab overuse in standing deep core coordination preceeding multifidis twist      Exercises   Other Exercises  reviewed multifidis twist ,coordination of deep core with 15 # kettle bell lifting and simulated lifting dtr who weighs 25 lbs. CKC hip strengthening ( side stepping/ mini squat)      Modalities   Modalities Moist Heat      Moist Heat Therapy   Number Minutes Moist Heat 10 Minutes    Moist Heat Location --   back, during instructions of deep core HEP     Manual Therapy   Manual therapy  comments STM/MWM at problem areas noted  in assesment to promote mobility at T 7  and increase  R trunk rotation                          PT Long Term Goals - 04/15/21 1043       PT LONG TERM GOAL #1   Title Pt will demo  proper deep core coordination without chest breathing and optimal excursion of diaphragm/pelvic floor to minimize pelvic pain and urinary Sx    Baseline chest breathing    Time 3    Period Weeks    Status New    Target Date 05/06/21      PT LONG TERM GOAL #2   Title Pt will demo proper alignment and technique with sitting, logrolling, sit to stand, lifting, body mechanics with ADLs to minimize straining of abdominopelvic floor mm and spine.    Baseline straining techniques, slumping in sitting    Time 2    Period Weeks    Status New    Target Date 04/29/21      PT LONG TERM GOAL #3   Title Pt will demo equal alignment of pelvic girdle and less convex curves along spine in order to progress to deep core HEP and minimize LBP and optimize pelvic floor function    Baseline L iliac crest higher , R thoracic, L lumbar convex curve    Time 6    Period Weeks    Status New     Target Date 05/27/21      PT LONG TERM GOAL #4   Title Pt will report no radiating LBP with driving, sitting, standing    Baseline radiating LBP with driving, sitting, standing    Time 10    Period Weeks    Status New    Target Date 06/24/21      PT LONG TERM GOAL #5   Title Pt will improve FOTO Lumbar score from 70  pts to > 75 pts in order to improve ADLs    Baseline 70 pts    Time 10    Period Weeks    Status New    Target Date 06/24/21      Additional Long Term Goals   Additional Long Term Goals Yes      PT LONG TERM GOAL #6   Title Pt will improve Urinary Problem score on FOTO from 56 pts to > 66 pts in order to urinate without delay and complete urination without pain    Time 10    Period Weeks    Status New    Target Date 06/24/21                   Plan - 04/23/21 1123     Clinical Impression Statement Pt requried manual Tx to minimize tightness along L lumbar and T/L junction. This allowed for more diaphragmatic excursion and progression towards deep core level 1-2. Pt required cues for more stability and coordination. Pt demo'd IND with technique post Tx.   Required cues for less abdominal straining when performing multifidis twist.   Addressed standing posture with urination to relax pelvic floor with propioception training to find anterior COM and less hyperextension of knees and excesive loading on heels.   PLan to assess pelvic floor next session now that his spine and pelvic alignment have improved and deep core mm system are in training.    Examination-Activity Limitations Toileting;Lift;Sit;Stairs  Stability/Clinical Decision Making Evolving/Moderate complexity    Rehab Potential Good    PT Frequency 1x / week    PT Duration Other (comment)   10   PT Treatment/Interventions Neuromuscular re-education;Balance training;Therapeutic exercise;Stair training;Functional mobility training;Gait training;Cryotherapy;Patient/family education;Therapeutic  activities;Manual lymph drainage;Manual techniques;Moist Heat;ADLs/Self Care Home Management;Traction;Joint Manipulations;Passive range of motion;Energy conservation    Consulted and Agree with Plan of Care Patient             Patient will benefit from skilled therapeutic intervention in order to improve the following deficits and impairments:  Decreased activity tolerance, Decreased balance, Decreased coordination, Postural dysfunction, Improper body mechanics, Increased muscle spasms, Decreased mobility, Decreased strength, Abnormal gait, Decreased endurance, Impaired sensation, Hypomobility, Decreased scar mobility, Pain, Decreased range of motion, Difficulty walking, Impaired flexibility  Visit Diagnosis: Sacrococcygeal disorders, not elsewhere classified  Other lack of coordination  Other abnormalities of gait and mobility  Pelvic floor dysfunction  Chronic bilateral low back pain with bilateral sciatica     Problem List Patient Active Problem List   Diagnosis Date Noted   Gastroesophageal reflux disease 05/14/2020   Overweight (BMI 25.0-29.9) 02/07/2020   HSV (herpes simplex virus) infection 12/27/2017   Hx of substance abuse ([place]) 11/13/2017   Insomnia 11/08/2017   Nicotine dependence, cigarettes, uncomplicated 54/56/2563   Genital warts    History of ADHD     Jerl Mina, PT 04/23/2021, 11:53 AM  [place] [address] [place], [place], [address] Phone: [phone]   Fax:  [phone]  Name: Thomas Sawyer MRN: [id number] Date of Birth: 07/25/1985

## 2021-04-27 ENCOUNTER — Encounter: Payer: Managed Care, Other (non HMO) | Admitting: Physical Therapy

## 2021-05-10 ENCOUNTER — Encounter: Payer: Managed Care, Other (non HMO) | Admitting: Physical Therapy

## 2021-05-14 ENCOUNTER — Other Ambulatory Visit: Payer: Self-pay

## 2021-05-14 ENCOUNTER — Ambulatory Visit: Payer: Managed Care, Other (non HMO) | Attending: Urology | Admitting: Physical Therapy

## 2021-05-14 DIAGNOSIS — G8929 Other chronic pain: Secondary | ICD-10-CM | POA: Insufficient documentation

## 2021-05-14 DIAGNOSIS — R278 Other lack of coordination: Secondary | ICD-10-CM | POA: Diagnosis present

## 2021-05-14 DIAGNOSIS — M5442 Lumbago with sciatica, left side: Secondary | ICD-10-CM | POA: Insufficient documentation

## 2021-05-14 DIAGNOSIS — R2689 Other abnormalities of gait and mobility: Secondary | ICD-10-CM | POA: Insufficient documentation

## 2021-05-14 DIAGNOSIS — M533 Sacrococcygeal disorders, not elsewhere classified: Secondary | ICD-10-CM | POA: Diagnosis present

## 2021-05-14 DIAGNOSIS — M5441 Lumbago with sciatica, right side: Secondary | ICD-10-CM | POA: Diagnosis present

## 2021-05-14 DIAGNOSIS — M6289 Other specified disorders of muscle: Secondary | ICD-10-CM | POA: Insufficient documentation

## 2021-05-14 NOTE — Therapy (Signed)
[place] MAIN [facility] SERVICES [address] [place], [place], [address] Phone: [phone]   Fax:  [phone]  Physical Therapy Treatment  Patient Details  Name: Thomas Sawyer MRN: [id number] Date of Birth: January 31, 1986 Referring Provider (PT): Erlene Quan         MD   Encounter Date: 05/14/2021   PT End of Session - 05/14/21 1122     Visit Number 4    Number of Visits 10    Date for PT Re-Evaluation 06/24/21    PT Start Time 4970    PT Stop Time 1215    PT Time Calculation (min) 70 min    Activity Tolerance Patient tolerated treatment well;No increased pain    Behavior During Therapy WFL for tasks assessed/performed             Past Medical History:  Diagnosis Date   ADHD (attention deficit hyperactivity disorder)    Anxiety    Depression    Depression    Genital warts    Hiatal hernia    Oppositional defiant behavior     Past Surgical History:  Procedure Laterality Date   BRAIN SURGERY  05/02/2009   tumor removal    There were no vitals filed for this visit.   Subjective Assessment - 05/14/21 1114     Subjective Pt reports his urination is better but stream is not coming out in spurts as much. Pt returned to turning task at work and remembered to practice the proper technique. Pt remembers to breathe to relax his pelvic floor when urinating.    Pertinent History Pt used to do sit ups and crunches but not currently. 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 H x o ffall onto buttocks from a tramopline  when he was younger    Patient Stated Goals To  feel  relief and understand his body                [facility] PT Assessment - 05/14/21 1210       Coordination   Coordination and Movement Description overuse of ab with deep core      Palpation   Palpation comment R aductor tightness                         Pelvic Floor Special Questions - 05/14/21 1210     External Perineal Exam through undergarments: tightness and tenderness at R anterior mm > L , limited upward movement, dyscoordination of pelvic floor , ( post Tx: improved coordination )   overuse adductor and ab for cue for pelvic floor contraction              [facility] Adult PT Treatment/Exercise - 05/14/21 1211       Therapeutic Activites    Other Therapeutic Activities cued for physiology and anatomy of anatomy physiology for better completion of urination and stronger flow with relaxation of anterior pelvic floor mm      Neuro Re-ed    Neuro Re-ed Details  cued for pelvic floor coordination with abd mm and pelvic floor , strerches      Modalities   Modalities Moist Heat      Moist Heat Therapy   Number Minutes Moist Heat 5 Minutes    Moist Heat Location --  perineum with butterfly pose with guided relaxation     Manual Therapy   Manual therapy comments STM/MWM at problem areas noted  in assesment to promote mobility at supra pubic area and R pelvic floor mobility                          PT Long Term Goals - 05/14/21 1122       PT LONG TERM GOAL #1   Title Pt will demo  proper deep core coordination without chest breathing and optimal excursion of diaphragm/pelvic floor to minimize pelvic pain and urinary Sx    Baseline chest breathing    Time 3    Period Weeks    Status Achieved    Target Date 05/06/21      PT LONG TERM GOAL #2   Title Pt will demo proper alignment and technique with sitting, logrolling, sit to stand, lifting, body mechanics with ADLs to minimize straining of abdominopelvic floor mm and spine.    Baseline straining techniques, slumping in sitting    Time 2    Period Weeks    Status Achieved    Target Date 04/29/21      PT LONG TERM GOAL #3   Title Pt will demo equal alignment of pelvic girdle and less convex curves along spine in order to progress  to deep core HEP and minimize LBP and optimize pelvic floor function    Baseline L iliac crest higher , R thoracic, L lumbar convex curve    Time 6    Period Weeks    Status Achieved    Target Date 05/27/21      PT LONG TERM GOAL #4   Title Pt will report no radiating LBP with driving, sitting, standing    Baseline radiating LBP with driving, sitting, standing ( 05/14/21: no pain)    Time 10    Period Weeks    Status Achieved    Target Date 06/24/21      PT LONG TERM GOAL #5   Title Pt will improve FOTO Lumbar score from 70  pts to > 75 pts in order to improve ADLs ( 05/14/21: 94 pts)    Baseline 70 pts    Time 10    Period Weeks    Status Achieved    Target Date 06/24/21      PT LONG TERM GOAL #6   Title Pt will improve Urinary Problem score on FOTO from 56 pts to > 66 pts in order to urinate without delay and complete urination without pain  ( 05/14/21: 64 pts)    Time 10    Period Weeks    Status Achieved    Target Date 06/24/21                   Plan - 05/14/21 1122     Clinical Impression Statement Pt showed tighter  R pelvic floor mm which decreased post Tx. Pt demo'd improved pelvic tilt propioception and lengthened pelvic floor with coordination of deep core system post training. Pt showed IND with pelvic floor stretches. Plan to progress to quick pelvic floor contractions next session because pt showed incorrect technique for closure of sphinctures. Pt was explained anatomy and phsyiology of anterior pelvic floor mm to help with finishing urinating and stronger urine stream.   Pt continues to benefit from skilled PT.   Examination-Activity Limitations Toileting;Lift;Sit;Stairs    Stability/Clinical Decision Making Evolving/Moderate complexity  Rehab Potential Good    PT Frequency 1x / week    PT Duration Other (comment)   10   PT Treatment/Interventions Neuromuscular re-education;Balance training;Therapeutic exercise;Stair training;Functional mobility  training;Gait training;Cryotherapy;Patient/family education;Therapeutic activities;Manual lymph drainage;Manual techniques;Moist Heat;ADLs/Self Care Home Management;Traction;Joint Manipulations;Passive range of motion;Energy conservation    Consulted and Agree with Plan of Care Patient             Patient will benefit from skilled therapeutic intervention in order to improve the following deficits and impairments:  Decreased activity tolerance, Decreased balance, Decreased coordination, Postural dysfunction, Improper body mechanics, Increased muscle spasms, Decreased mobility, Decreased strength, Abnormal gait, Decreased endurance, Impaired sensation, Hypomobility, Decreased scar mobility, Pain, Decreased range of motion, Difficulty walking, Impaired flexibility  Visit Diagnosis: Sacrococcygeal disorders, not elsewhere classified  Other lack of coordination  Other abnormalities of gait and mobility  Pelvic floor dysfunction  Chronic bilateral low back pain with bilateral sciatica     Problem List Patient Active Problem List   Diagnosis Date Noted   Gastroesophageal reflux disease 05/14/2020   Overweight (BMI 25.0-29.9) 02/07/2020   HSV (herpes simplex virus) infection 12/27/2017   Hx of substance abuse ([place]) 11/13/2017   Insomnia 11/08/2017   Nicotine dependence, cigarettes, uncomplicated 83/72/9021   Genital warts    History of ADHD     Jerl Mina, PT 05/14/2021, 12:15 PM  [place] MAIN [facility] SERVICES [address] [place], [place], [address] Phone: [phone]   Fax:  [phone]  Name: DAQUAVION CATALA MRN: [id number] Date of Birth: 07-Jul-1985

## 2021-05-14 NOTE — Patient Instructions (Addendum)
Stretch for pelvic floor   Pelvic tilts ( head lights)    V- slides  "v heels slide away and then back toward buttocks and then rock knee to slight ,  slide heel along at 11 o clock away from buttocks   10 reps      Mermaid stretch  Rocking while seated on the floor with heels to one side of the hip Heels to one side of the hip  Rock forward towards the knee that is bent , rock beck towards the opposite sitting bones ___   With Deep core level 1-2 - find neutral pelvic tilt first, feet firm    Practice proper pelvic floor coordination  Inhale: expand pelvic floor muscles Exhale" "j" scoop, allow pelvic floor to close, lift first before belly sinks   ( not "draw abdominal muscle to spine" or strain with abdominal muscles")

## 2021-05-17 ENCOUNTER — Encounter: Payer: Managed Care, Other (non HMO) | Admitting: Physical Therapy

## 2021-05-25 ENCOUNTER — Encounter: Payer: Managed Care, Other (non HMO) | Admitting: Physical Therapy

## 2021-05-28 ENCOUNTER — Other Ambulatory Visit: Payer: Self-pay

## 2021-05-28 ENCOUNTER — Ambulatory Visit: Payer: Managed Care, Other (non HMO) | Admitting: Physical Therapy

## 2021-05-28 DIAGNOSIS — M5442 Lumbago with sciatica, left side: Secondary | ICD-10-CM

## 2021-05-28 DIAGNOSIS — M533 Sacrococcygeal disorders, not elsewhere classified: Secondary | ICD-10-CM | POA: Diagnosis not present

## 2021-05-28 DIAGNOSIS — R278 Other lack of coordination: Secondary | ICD-10-CM

## 2021-05-28 DIAGNOSIS — M6289 Other specified disorders of muscle: Secondary | ICD-10-CM

## 2021-05-28 DIAGNOSIS — R2689 Other abnormalities of gait and mobility: Secondary | ICD-10-CM

## 2021-05-28 DIAGNOSIS — G8929 Other chronic pain: Secondary | ICD-10-CM

## 2021-05-28 NOTE — Therapy (Signed)
[place] MAIN [facility] SERVICES [address] [place], [place], [address] Phone: [phone]   Fax:  [phone]  Physical Therapy Treatment  Patient Details  Name: Thomas Sawyer MRN: [id number] Date of Birth: 1985-12-14 Referring Provider (PT): Erlene Quan         MD   Encounter Date: 05/28/2021   PT End of Session - 05/28/21 1120     Visit Number 5    Number of Visits 10    Date for PT Re-Evaluation 06/24/21    PT Start Time 1100    PT Stop Time 1200    PT Time Calculation (min) 60 min    Activity Tolerance Patient tolerated treatment well;No increased pain    Behavior During Therapy WFL for tasks assessed/performed             Past Medical History:  Diagnosis Date   ADHD (attention deficit hyperactivity disorder)    Anxiety    Depression    Depression    Genital warts    Hiatal hernia    Oppositional defiant behavior     Past Surgical History:  Procedure Laterality Date   BRAIN SURGERY  05/02/2009   tumor removal    There were no vitals filed for this visit.   Subjective Assessment - 05/28/21 1107     Subjective Pt reported the standing posture technique helps with urination. Pt had to move the fenders off the machine with repetitive turns this last week but he noticed less back pain.    Pertinent History Pt used to do sit ups and crunches but not currently. 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 H x o ffall onto buttocks from a tramopline  when he was younger    Patient Stated Goals To  feel  relief and understand his body                            Pelvic Floor Special Questions - 05/28/21 1436     External Perineal Exam through undergarments: tightness and tightness at R deep transverse perineal, bulbopspongiosus R, adductors   overuse adductor and ab for cue for pelvic  floor contraction              [facility] Adult PT Treatment/Exercise - 05/28/21 1433       Therapeutic Activites    Other Therapeutic Activities Provided explanation of anatomy/ physiology and propioception training with work task, standing posture, and urination , explained pelvic floor function and its impact of urine stream and initiation      Neuro Re-ed    Neuro Re-ed Details  Cued for body  mechanics for repated trunk rotation task at work and complimentary stretches to minimize adductor/ R pelvic floor and L lumbar mm      Modalities   Modalities Moist Heat      Moist Heat Therapy   Moist Heat Location --   perineum with butterfly pose with guided relaxation     Manual Therapy   Manual therapy comments STM/MWM at problem areas noted  in assesment to promote mobility and proper lengthening of pelvic floor and adductor R  PT Long Term Goals - 05/14/21 1122       PT LONG TERM GOAL #1   Title Pt will demo  proper deep core coordination without chest breathing and optimal excursion of diaphragm/pelvic floor to minimize pelvic pain and urinary Sx    Baseline chest breathing    Time 3    Period Weeks    Status Achieved    Target Date 05/06/21      PT LONG TERM GOAL #2   Title Pt will demo proper alignment and technique with sitting, logrolling, sit to stand, lifting, body mechanics with ADLs to minimize straining of abdominopelvic floor mm and spine.    Baseline straining techniques, slumping in sitting    Time 2    Period Weeks    Status Achieved    Target Date 04/29/21      PT LONG TERM GOAL #3   Title Pt will demo equal alignment of pelvic girdle and less convex curves along spine in order to progress to deep core HEP and minimize LBP and optimize pelvic floor function    Baseline L iliac crest higher , R thoracic, L lumbar convex curve    Time 6    Period Weeks    Status Achieved    Target Date 05/27/21      PT LONG TERM  GOAL #4   Title Pt will report no radiating LBP with driving, sitting, standing    Baseline radiating LBP with driving, sitting, standing ( 05/14/21: no pain)    Time 10    Period Weeks    Status Achieved    Target Date 06/24/21      PT LONG TERM GOAL #5   Title Pt will improve FOTO Lumbar score from 70  pts to > 75 pts in order to improve ADLs ( 05/14/21: 94 pts)    Baseline 70 pts    Time 10    Period Weeks    Status Achieved    Target Date 06/24/21      PT LONG TERM GOAL #6   Title Pt will improve Urinary Problem score on FOTO from 56 pts to > 66 pts in order to urinate without delay and complete urination without pain  ( 05/14/21: 64 pts)    Time 10    Period Weeks    Status Achieved    Target Date 06/24/21                   Plan - 05/28/21 1121     Clinical Impression Statement PT continues to show levelled pelvic girdle and spine and improved posture. 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   Pt is more attentive to not twisting his back with repeated L turns but required more cues for proper lifting of foot, bending of knees, keeping COM more anterior to minimize injuries.   Pt required cues for proper standing posture and propioception of lower kinetic chain in deep core HEP today. Pt continues to benefit from skilled PT.      Examination-Activity Limitations Toileting;Lift;Sit;Stairs    Stability/Clinical Decision Making Evolving/Moderate complexity    Rehab Potential Good    PT Frequency 1x / week    PT Duration Other (comment)   10   PT Treatment/Interventions Neuromuscular re-education;Balance training;Therapeutic exercise;Stair training;Functional mobility training;Gait training;Cryotherapy;Patient/family education;Therapeutic activities;Manual lymph drainage;Manual techniques;Moist  Heat;ADLs/Self Care Home  Management;Traction;Joint Manipulations;Passive range of motion;Energy conservation    Consulted and Agree with Plan of Care Patient             Patient will benefit from skilled therapeutic intervention in order to improve the following deficits and impairments:  Decreased activity tolerance, Decreased balance, Decreased coordination, Postural dysfunction, Improper body mechanics, Increased muscle spasms, Decreased mobility, Decreased strength, Abnormal gait, Decreased endurance, Impaired sensation, Hypomobility, Decreased scar mobility, Pain, Decreased range of motion, Difficulty walking, Impaired flexibility  Visit Diagnosis: Other lack of coordination  Sacrococcygeal disorders, not elsewhere classified  Other abnormalities of gait and mobility  Pelvic floor dysfunction  Chronic bilateral low back pain with bilateral sciatica     Problem List Patient Active Problem List   Diagnosis Date Noted   Gastroesophageal reflux disease 05/14/2020   Overweight (BMI 25.0-29.9) 02/07/2020   HSV (herpes simplex virus) infection 12/27/2017   Hx of substance abuse ([place]) 11/13/2017   Insomnia 11/08/2017   Nicotine dependence, cigarettes, uncomplicated 35/57/3220   Genital warts    History of ADHD     Jerl Mina, PT 05/28/2021, 2:38 PM  [place] [address] [place], [place], [address] Phone: [phone]   Fax:  [phone]  Name: Thomas Sawyer MRN: [id number] Date of Birth: 1985-09-10

## 2021-05-28 NOTE — Patient Instructions (Signed)
Work modifications:  1) Counteracting stretch to the tightness at R thigh:  Figure-4 stretch   Lying on back: butterfly with pillow under knees  2) Keep knees bent soft, pick up the knees/ foot to turn with whole body not legs/ foot planted in place .Ballmounds not heels for landing. [place] slightly forward over ballounds  ___ When doing Deep core level 1-2 :  -find pelvic tilt w/ anterior with feet firm   ___  When urinating AND standing:   Keep your center forward over ballmounds

## 2021-05-31 IMAGING — US US SCROTUM W/ DOPPLER COMPLETE
2 series · 14 of 25 positions shown · non-contrast
Comparison: None.

CLINICAL DATA: Right-sided testicular pain for several months

EXAM:
SCROTAL ULTRASOUND
DOPPLER ULTRASOUND OF THE TESTICLES
TECHNIQUE: Complete ultrasound examination of the testicles, epididymis, and
other scrotal structures was performed. Color and spectral Doppler
ultrasound were also utilized to evaluate blood flow to the
testicles.

[Series 1: us scrotum w/ doppler complete · 0.08mm/px · 7 of 30 slices shown (1 of 2)]
[im 1/30]
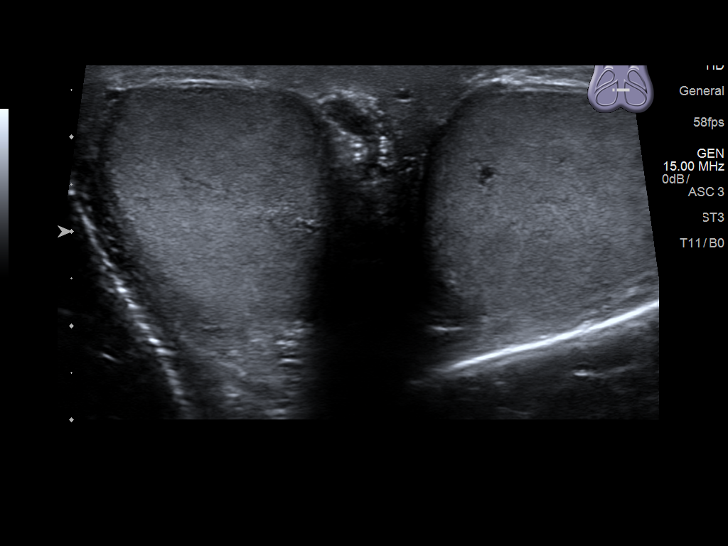
[im 6/30]
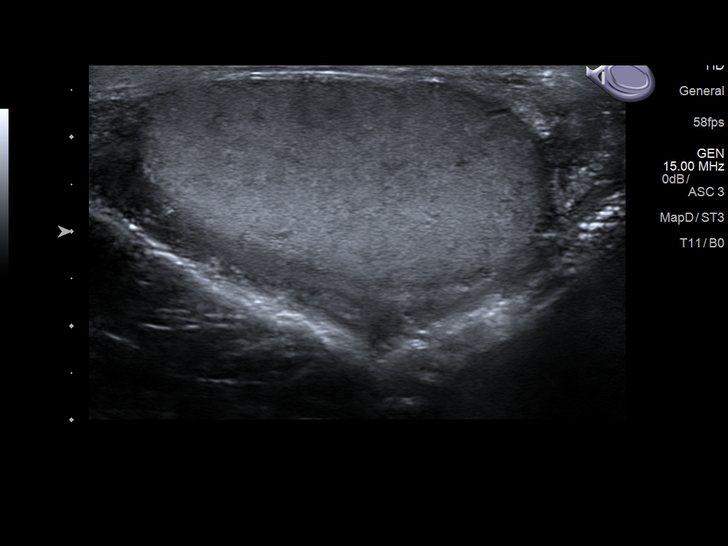
[im 11/30]
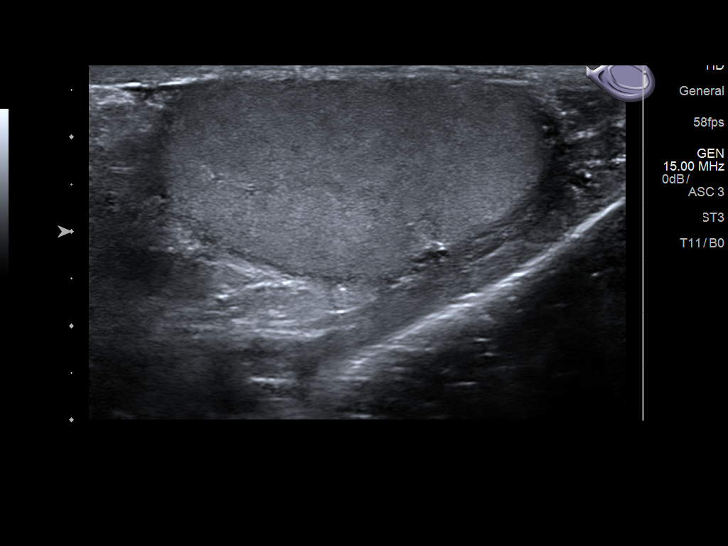
[im 16/30]
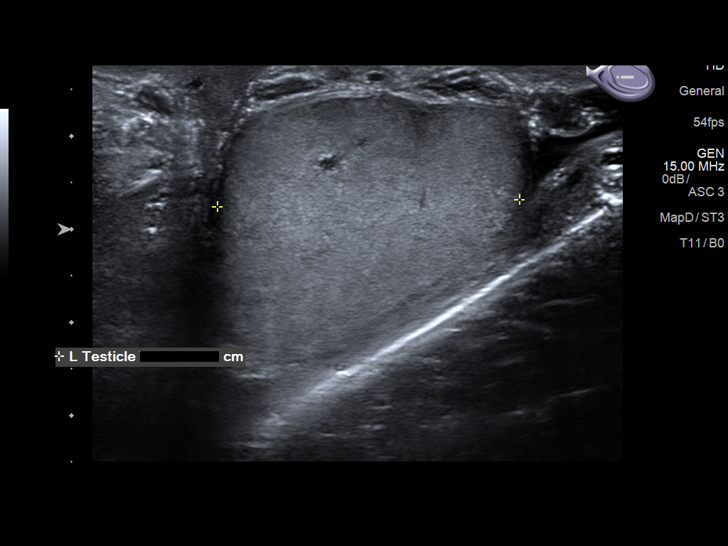
[im 22/30]
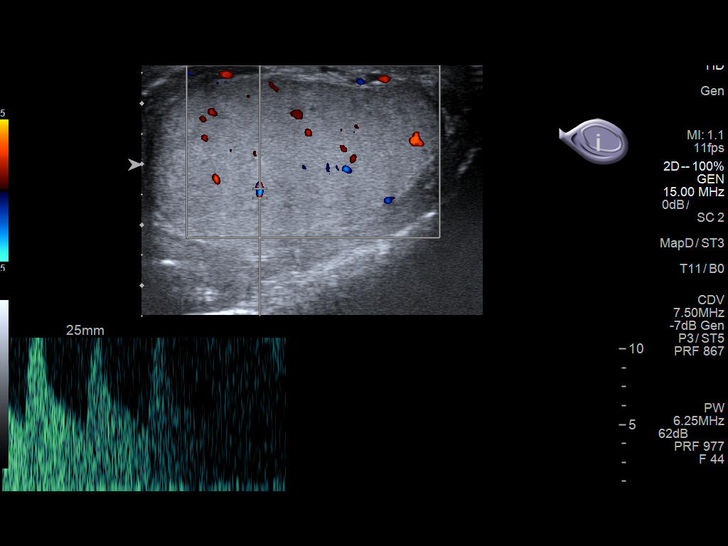
[im 24/30]
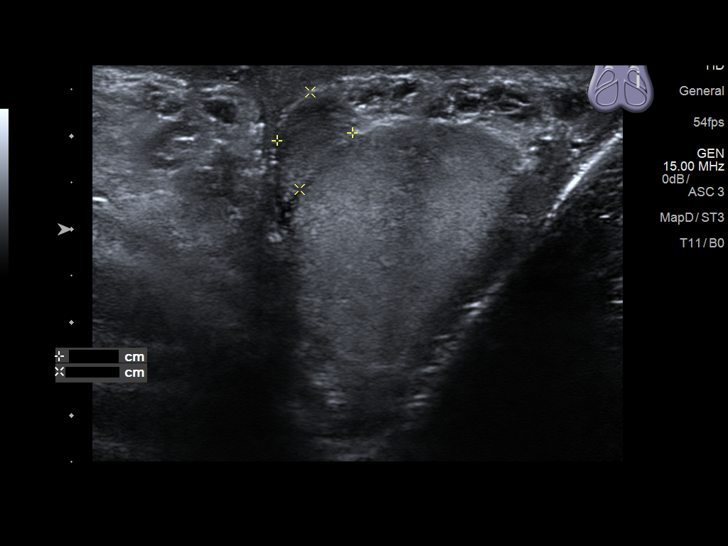
[im 30/30]
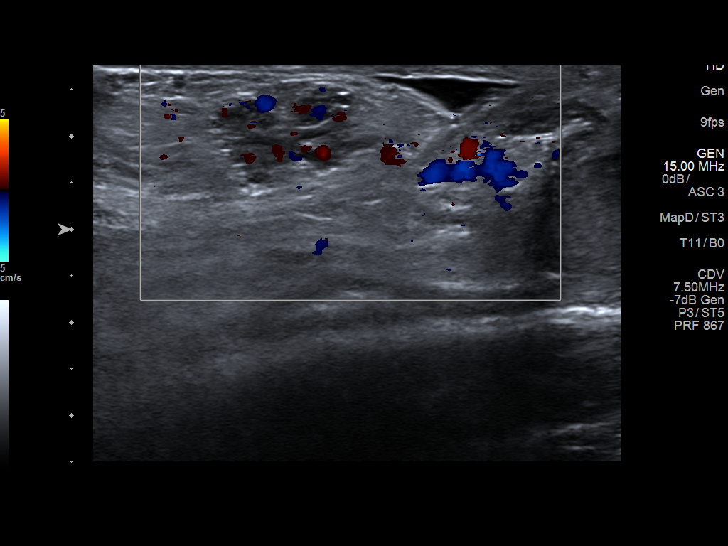

[Series 1: us scrotum w/ doppler complete · 0.08mm/px · 7 of 31 slices shown (2 of 2)]
[im 3/31]
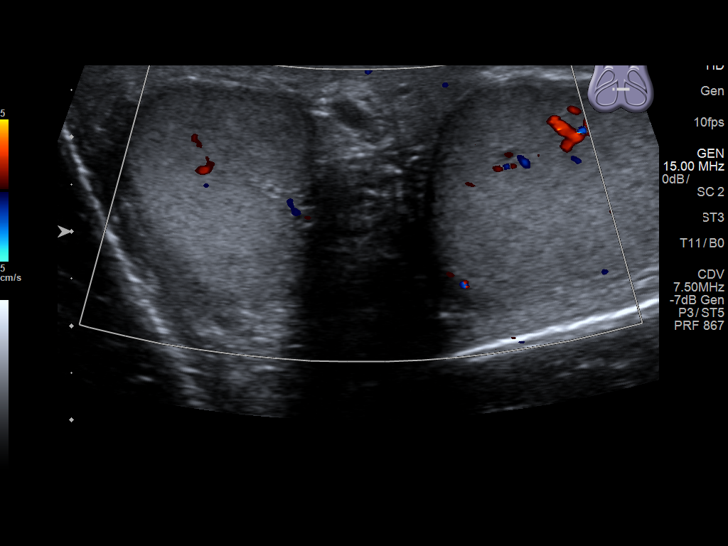
[im 8/31]
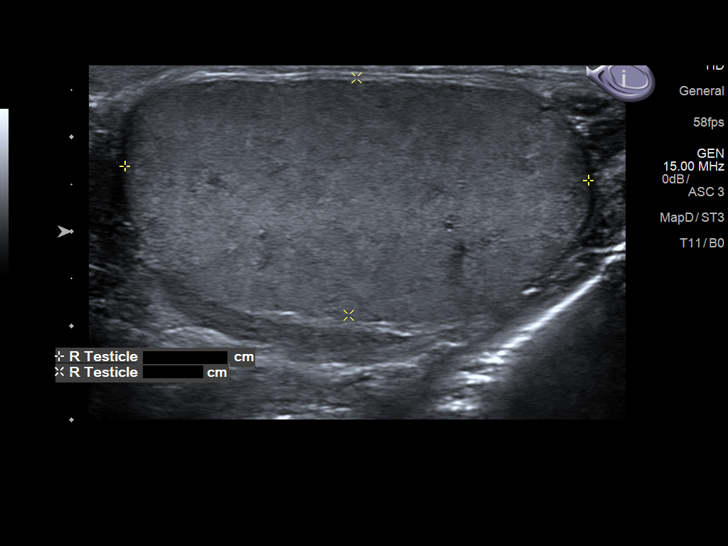
[im 11/31]
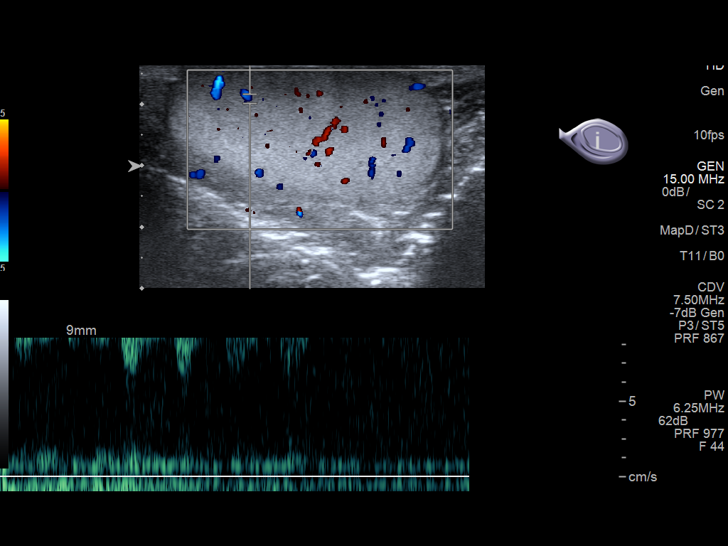
[im 16/31]
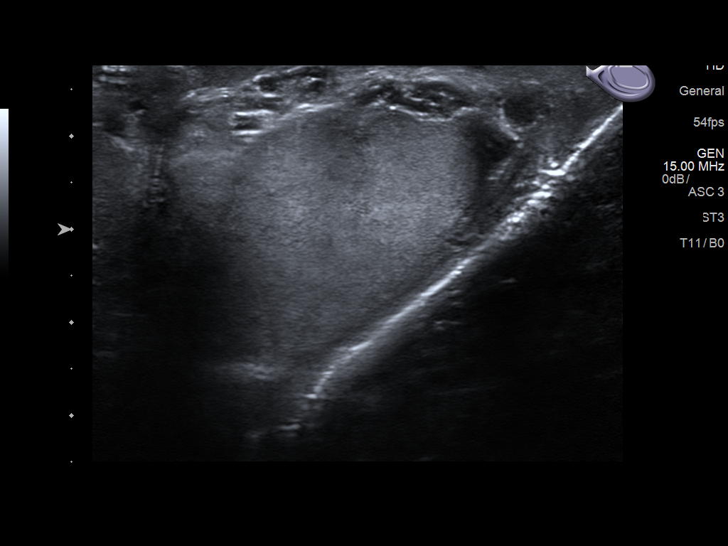
[im 21/31]
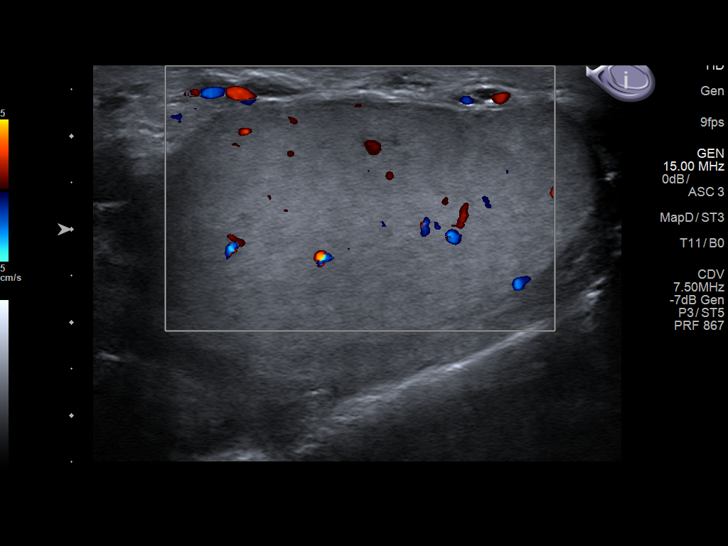
[im 26/31]
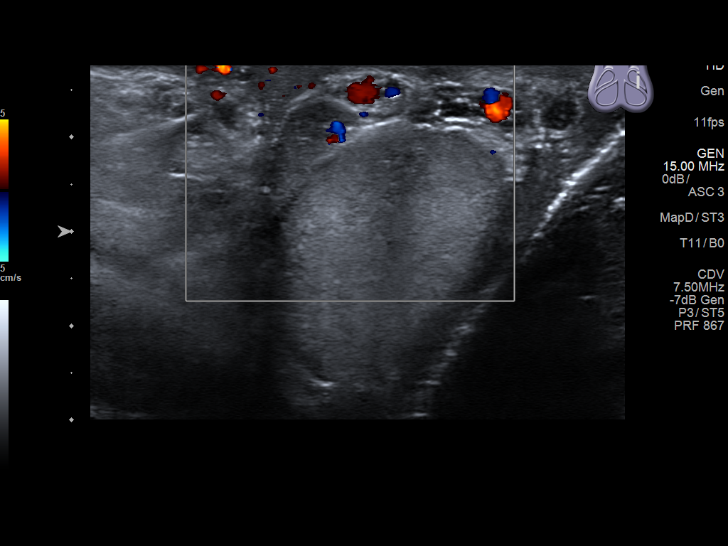
[im 31/31]
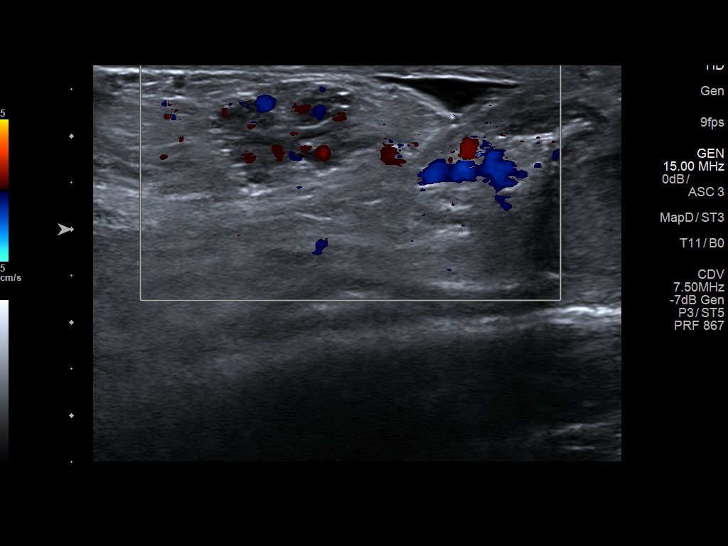

[14 of 25 positions shown; findings below may reference images not displayed]

FINDINGS: Right testicle

Measurements: 4.9 x 2.5 x 3.4 cm. No mass or microlithiasis
visualized.

Left testicle

Measurements: 4.9 x 2.6 x 3.2 cm. No mass or microlithiasis
visualized.

Right epididymis:  Normal in size and appearance.

Left epididymis:  Normal in size and appearance.

Hydrocele:  None visualized.

Varicocele:  Small left varicocele is noted.

Pulsed Doppler interrogation of both testes demonstrates normal low
resistance arterial and venous waveforms bilaterally.
IMPRESSION: Small left varicocele.  No other focal abnormality is noted.

## 2021-06-01 ENCOUNTER — Encounter: Payer: Managed Care, Other (non HMO) | Admitting: Physical Therapy

## 2021-06-10 ENCOUNTER — Encounter: Payer: Managed Care, Other (non HMO) | Admitting: Physical Therapy

## 2021-06-11 ENCOUNTER — Ambulatory Visit: Payer: Managed Care, Other (non HMO) | Attending: Urology | Admitting: Physical Therapy

## 2021-06-11 ENCOUNTER — Other Ambulatory Visit: Payer: Self-pay

## 2021-06-11 DIAGNOSIS — M5442 Lumbago with sciatica, left side: Secondary | ICD-10-CM | POA: Insufficient documentation

## 2021-06-11 DIAGNOSIS — G8929 Other chronic pain: Secondary | ICD-10-CM | POA: Insufficient documentation

## 2021-06-11 DIAGNOSIS — M533 Sacrococcygeal disorders, not elsewhere classified: Secondary | ICD-10-CM | POA: Insufficient documentation

## 2021-06-11 DIAGNOSIS — R2689 Other abnormalities of gait and mobility: Secondary | ICD-10-CM | POA: Insufficient documentation

## 2021-06-11 DIAGNOSIS — R278 Other lack of coordination: Secondary | ICD-10-CM | POA: Insufficient documentation

## 2021-06-11 DIAGNOSIS — M5441 Lumbago with sciatica, right side: Secondary | ICD-10-CM | POA: Insufficient documentation

## 2021-06-11 DIAGNOSIS — M6289 Other specified disorders of muscle: Secondary | ICD-10-CM | POA: Insufficient documentation

## 2021-06-11 NOTE — Therapy (Signed)
[place] [facility] [address] [place], [place], [address] Phone: [phone]   Fax:  [phone]  Patient Details  Name: Thomas Sawyer MRN: [id number] Date of Birth: 1986/01/08 Referring Provider:  Hollice Espy, MD  Encounter Date: 06/11/2021  Discharge Summary  Pt has achieved 100% of his goals. His back pain has improved with  manual Tx and by modifying his repeated task at work to minimize asymmetrical alignment of his back and pelvis. Pt's pelvic floor tightness has decreased and pt is IND with proper lengthening of mm to help with urination with less pain. Pt is IND with his HEP and understands the importance of maintaining HEP for back strength and proper deep core coordination and strengthening.   Pt is ready for d/c today.    PT Long Term Goals - 06/11/21 1112       PT LONG TERM GOAL #1   Title Pt will demo  proper deep core coordination without chest breathing and optimal excursion of diaphragm/pelvic floor to minimize pelvic pain and urinary Sx    Baseline chest breathing    Time 3    Period Weeks    Status Achieved    Target Date 05/06/21      PT LONG TERM GOAL #2   Title Pt will demo proper alignment and technique with sitting, logrolling, sit to stand, lifting, body mechanics with ADLs to minimize straining of abdominopelvic floor mm and spine.    Baseline straining techniques, slumping in sitting    Time 2    Period Weeks    Status Achieved    Target Date 04/29/21      PT LONG TERM GOAL #3   Title Pt will demo equal alignment of pelvic girdle and less convex curves along spine in order to progress to deep core HEP and minimize LBP and optimize pelvic floor function    Baseline L iliac crest higher , R thoracic, L lumbar convex curve    Time 6    Period Weeks    Status Achieved    Target Date 05/27/21      PT LONG TERM GOAL #4   Title Pt will report no radiating LBP with driving, sitting, standing     Baseline radiating LBP with driving, sitting, standing ( 05/14/21: no pain)    Time 10    Period Weeks    Status Achieved    Target Date 06/24/21      PT LONG TERM GOAL #5   Title Pt will improve FOTO Lumbar score from 70  pts to > 75 pts in order to improve ADLs ( 05/14/21: 94 pts)    Baseline 70 pts    Time 10    Period Weeks    Status Achieved    Target Date 06/24/21      PT LONG TERM GOAL #6   Title Pt will improve Urinary Problem score on FOTO from 56 pts to > 66 pts in order to urinate without delay and complete urination without pain  ( 05/14/21: 64 pts)    Time 10    Period Weeks    Status Achieved    Target Date 06/24/21               Jerl Mina, PT 06/11/2021, 11:27 AM  [place], [place], [address] Phone: [phone]   Fax:  [phone]

## 2021-06-17 ENCOUNTER — Encounter: Payer: Managed Care, Other (non HMO) | Admitting: Physical Therapy

## 2021-06-18 ENCOUNTER — Ambulatory Visit: Payer: Managed Care, Other (non HMO) | Admitting: Physical Therapy

## 2021-06-24 ENCOUNTER — Encounter: Payer: Managed Care, Other (non HMO) | Admitting: Physical Therapy

## 2021-07-01 ENCOUNTER — Encounter: Payer: Managed Care, Other (non HMO) | Admitting: Physical Therapy

## 2021-07-08 ENCOUNTER — Encounter: Payer: Managed Care, Other (non HMO) | Admitting: Physical Therapy

## 2021-07-09 ENCOUNTER — Encounter: Payer: Managed Care, Other (non HMO) | Admitting: Physical Therapy

## 2021-07-15 ENCOUNTER — Encounter: Payer: Managed Care, Other (non HMO) | Admitting: Physical Therapy

## 2021-07-16 ENCOUNTER — Encounter: Payer: Managed Care, Other (non HMO) | Admitting: Physical Therapy

## 2021-07-22 ENCOUNTER — Encounter: Payer: Managed Care, Other (non HMO) | Admitting: Physical Therapy

## 2021-07-23 ENCOUNTER — Encounter: Payer: Managed Care, Other (non HMO) | Admitting: Physical Therapy

## 2021-11-29 ENCOUNTER — Ambulatory Visit: Payer: Self-pay | Admitting: *Deleted

## 2021-11-29 NOTE — Telephone Encounter (Signed)
  Chief Complaint: fainted Symptoms: fainted- brief, headache Frequency: yesterday Pertinent Negatives: Patient denies   Disposition: '[]'$ ED /'[]'$ Urgent Care (no appt availability in office) / '[x]'$ Appointment(In office/virtual)/ '[]'$  [facility]/ '[]'$ Home Care/ '[]'$ Refused Recommended Disposition /'[]'$ [facility]/ '[]'$  Follow-up with PCP Additional Notes: EMS came to home- patient testing was normal.

## 2021-11-29 NOTE — Telephone Encounter (Signed)
Reason for Disposition  [1] All other patients AND [2] now alert and feels fine  (Exception: SIMPLE FAINT due to stress, pain, prolonged standing, or suddenly standing)  Answer Assessment - Initial Assessment Questions 1. ONSET: "How long were you unconscious?" (minutes) "When did it happen?"     Seconds to 1 minutes, yesterday afternoon, EMS came- vital were stable 2. CONTENT: "What happened during period of unconsciousness?" (e.g., seizure activity)      Muttering, hostile/anger- but not aware 3. MENTAL STATUS: "Alert and oriented now?" (oriented x 3 = name, month, location)      Yes- patient is at work today 4. TRIGGER: "What do you think caused the fainting?" "What were you doing just before you fainted?"  (e.g., exercise, sudden standing up, prolonged standing)     On video game/headset 5. RECURRENT SYMPTOM: "Have you ever passed out before?" If Yes, ask: "When was the last time?" and "What happened that time?"      One other time- 2020- fell.  6. INJURY: "Did you sustain any injury during the fall?"      Goose egg on foot 7. CARDIAC SYMPTOMS: "Have you had any of the following symptoms: chest pain, difficulty breathing, palpitations?"     no 8. NEUROLOGIC SYMPTOMS: "Have you had any of the following symptoms: headache, numbness, vertigo, weakness?"     headache 9. GI SYMPTOMS: "Have you had any of the following symptoms: abdomen pain, vomiting, diarrhea, blood in stools?"     no 10. OTHER SYMPTOMS: "Do you have any other symptoms?"       congestion 11. PREGNANCY: "Is there any chance you are pregnant?" "When was your last menstrual period?"  Protocols used: Fainting-A-AH

## 2021-11-30 ENCOUNTER — Ambulatory Visit: Payer: Managed Care, Other (non HMO) | Admitting: Physician Assistant

## 2021-11-30 ENCOUNTER — Encounter: Payer: Self-pay | Admitting: Physician Assistant

## 2021-11-30 VITALS — BP 104/69 | HR 69 | Temp 97.8°F | Ht 72.99 in | Wt 219.0 lb

## 2021-11-30 DIAGNOSIS — Z86011 Personal history of benign neoplasm of the brain: Secondary | ICD-10-CM | POA: Diagnosis not present

## 2021-11-30 DIAGNOSIS — F1911 Other psychoactive substance abuse, in remission: Secondary | ICD-10-CM | POA: Diagnosis not present

## 2021-11-30 DIAGNOSIS — K219 Gastro-esophageal reflux disease without esophagitis: Secondary | ICD-10-CM

## 2021-11-30 DIAGNOSIS — R4020 Unspecified coma: Secondary | ICD-10-CM | POA: Insufficient documentation

## 2021-11-30 MED ORDER — PANTOPRAZOLE SODIUM 40 MG PO TBEC: 40.0000 mg | DELAYED_RELEASE_TABLET | Freq: Every day | ORAL | 1 refills | Status: DC

## 2021-11-30 NOTE — Progress Notes (Signed)
Established Patient Office Visit  Name: Thomas Sawyer   MRN: [id number]    DOB: [birth year]   Date:11/30/2021  Today's Provider: Talitha Givens, MHS, PA-C Introduced myself to the patient as a PA-C and provided education on APPs in clinical practice.         Subjective  Chief Complaint  Chief Complaint  Patient presents with   passed out    Happened on Sunday, only remembers waking up. Patient states that this is the second time in the past 2 years. Had brain surgery in 2011.    HPI   Reports Sunday he was playing video games and his wife found him on the floor, mumbling Wife called EMS to investigate Wife told him he wasn't speaking clearly He states he has had a similar experience about 2 years ago  When he woke up Sun he reports he was confused and has no memory of falling States he remembers being on the floor while wife was calling EMS States EMS found normal vitals Denies diaphoresis or shaking/tremor Denies being out in the heat over the weekend but states he wasn't feeling well all weekend- congestion, mild sore throat Doesn't think he hit head when he lost consciousness but is not sure Reports he "felt a little weird yesterday morning" but is not able to explain sensation  States he had a bit of a headache in the back of the head yesterday He has a hx of brain surgery in 2012 Repots he has been drug free for almost 5 years     Patient Active Problem List   Diagnosis Date Noted   Personal history of benign brain tumor 11/30/2021   Loss of consciousness ([place]) 11/30/2021   Gastroesophageal reflux disease 05/14/2020   Overweight (BMI 25.0-29.9) 02/07/2020   HSV (herpes simplex virus) infection 12/27/2017   Hx of substance abuse ([place]) 11/13/2017   Insomnia 11/08/2017   Nicotine dependence, cigarettes, uncomplicated 38/33/3832   Genital warts    History of ADHD     Past Surgical History:  Procedure Laterality Date   BRAIN SURGERY  05/02/2009   tumor  removal    Family History  Problem Relation Age of Onset   Asthma Father    Diabetes Father    Cancer - Lung Father    Cancer Neg Hx    COPD Neg Hx    Heart disease Neg Hx    Hypertension Neg Hx    Stroke Neg Hx    Prostate cancer Neg Hx    Sickle cell trait Neg Hx    Bladder Cancer Neg Hx    Kidney cancer Neg Hx     Social History   Tobacco Use   Smoking status: Former    Packs/day: 0.50    Years: 18.00    Total pack years: 9.00    Types: Cigarettes   Smokeless tobacco: Current    Types: Snuff  Substance Use Topics   Alcohol use: Not Currently    Comment: rare     Current Outpatient Medications:    valACYclovir (VALTREX) 1000 MG tablet, Take 1 tablet (1,000 mg total) by mouth 2 (two) times daily as needed., Disp: 20 tablet, Rfl: 3   pantoprazole (PROTONIX) 40 MG tablet, Take 1 tablet (40 mg total) by mouth daily., Disp: 90 tablet, Rfl: 1  No Known Allergies  I personally reviewed active problem list, medication list, allergies, notes from last encounter, lab results, other reviewed notes from Cancer  center regarding brain tumor with the patient/caregiver today.   Review of Systems  Constitutional:  Positive for malaise/fatigue. Negative for chills, diaphoresis and fever.  Eyes:  Negative for blurred vision and double vision.  Respiratory:  Negative for shortness of breath and wheezing.   Cardiovascular:  Negative for chest pain, palpitations and leg swelling.  Gastrointestinal:  Negative for diarrhea, nausea and vomiting.  Musculoskeletal:  Positive for falls.  Neurological:  Positive for loss of consciousness and headaches. Negative for dizziness, tremors, focal weakness and weakness.  Psychiatric/Behavioral:  Negative for depression and memory loss.       Objective  Vitals:   11/30/21 0954  BP: 104/69  Pulse: 69  Temp: 97.8 F (36.6 C)  TempSrc: Oral  SpO2: 98%  Weight: 219 lb (99.3 kg)  Height: 6' 0.99" (1.854 m)    Body mass index is 28.9  kg/m.  Physical Exam Vitals reviewed.  Constitutional:      General: He is awake.     Appearance: Normal appearance. He is well-developed, well-groomed and normal weight.  HENT:     Head: Normocephalic and atraumatic.  Eyes:     General: Lids are normal. Gaze aligned appropriately.     Extraocular Movements: Extraocular movements intact.     Right eye: Normal extraocular motion and no nystagmus.     Left eye: Normal extraocular motion and no nystagmus.     Conjunctiva/sclera: Conjunctivae normal.     Pupils: Pupils are equal, round, and reactive to light.  Neck:     Trachea: Phonation normal.  Cardiovascular:     Rate and Rhythm: Normal rate and regular rhythm.     Pulses: Normal pulses.          Radial pulses are 2+ on the right side and 2+ on the left side.     Heart sounds: Normal heart sounds. No murmur heard.    No friction rub. No gallop.  Pulmonary:     Effort: Pulmonary effort is normal.     Breath sounds: Normal breath sounds. No decreased air movement. No decreased breath sounds, wheezing, rhonchi or rales.  Musculoskeletal:     Cervical back: Normal range of motion and neck supple.     Right lower leg: No edema.     Left lower leg: No edema.  Neurological:     General: No focal deficit present.     Mental Status: He is alert and oriented to person, place, and time.     GCS: GCS eye subscore is 4. GCS verbal subscore is 5. GCS motor subscore is 6.     Cranial Nerves: No cranial nerve deficit, dysarthria or facial asymmetry.     Motor: No weakness or tremor.     Coordination: Coordination normal. Heel to Shin Test normal.     Gait: Gait is intact.  Psychiatric:        Attention and Perception: Attention and perception normal.        Mood and Affect: Mood and affect normal.        Speech: Speech normal.        Behavior: Behavior normal. Behavior is cooperative.        Thought Content: Thought content normal.        Cognition and Memory: Cognition normal.         Judgment: Judgment normal.      No results found for this or any previous visit (from the past 2160 hour(s)).   PHQ2/9:    02/12/2021  2:02 PM 02/07/2020    1:16 PM 12/28/2018    2:52 PM 07/06/2015    3:04 PM  Depression screen PHQ 2/9  Decreased Interest 0 0 0 0  Down, Depressed, Hopeless 0 0 0 0  PHQ - 2 Score 0 0 0 0  Altered sleeping 0  1   Tired, decreased energy 0  1   Change in appetite 0  0   Feeling bad or failure about yourself  0  0   Trouble concentrating 0  1   Moving slowly or fidgety/restless 0  0   Suicidal thoughts 0  0   PHQ-9 Score 0  3   Difficult doing work/chores Not difficult at all         Fall Risk:    02/12/2021    2:15 PM 02/07/2020    1:15 PM 12/28/2018    2:52 PM  Fall Risk   Falls in the past year? 0 0 1  Number falls in past yr: 0 0 0  Injury with Fall? 0 0 1  Risk for fall due to : No Fall Risks No Fall Risks   Follow up Falls prevention discussed Falls evaluation completed       Functional Status Survey:      Assessment & Plan  Problem List Items Addressed This Visit       Digestive   Gastroesophageal reflux disease   Relevant Medications   pantoprazole (PROTONIX) 40 MG tablet     Other   Hx of substance abuse ([place])    Reports he has maintained sobriety for the last 5 years but is concerned for impact this may have on body and overall health given recent LOC and hx of benign brain tumor.       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 Derrill Memo in Dec of 2012 Patient was known to have had seizure-like activity and isolated episodes of confusion prior to surgery Reports he has not followed up with [facility] for monitoring since this 2013.       Relevant Orders   Ambulatory referral to Neurology   MR Brain W Wo Contrast   Loss of consciousness ([place]) - Primary    Acute, new  concern Reports he was found on the ground by his wife, confused and speaking incoherently for several minutes on Sunday- states he does not remember incident, only waking up EMS was called and evaluated vitals which were normal Pt reports he has a hx of benign brain tumor and these symptoms are similar to when he had issues with this in the past He is unsure if he his head on Sunday and denies exertion or heat exposure over the weekend Will check CMP, CBC to rule out anemia, dehydration Will place neuro referral today to assist with monitoring for recurrence of tumor and rule out seizure activity  Order for MRI brain placed today to check for tumor recurrence and internal injury following fall- neuro exam reassuring at today's apt.  Follow up as indicated by labs and imaging results.       Relevant Orders   Ambulatory referral to Neurology   Comp Met ([facility])   CBC w/Diff   MR Brain W Wo Contrast     No follow-ups on file.   I, Antonetta Clanton E Elona Yinger, PA-C, have reviewed all documentation for this visit. The documentation on 11/30/21  for the exam, diagnosis, procedures, and orders are all accurate and complete.   Talitha Givens, MHS, PA-C [facility]

## 2021-11-30 NOTE — Assessment & Plan Note (Addendum)
Acute, new concern Reports he was found on the ground by his wife, confused and speaking incoherently for several minutes on Sunday- states he does not remember incident, only waking up EMS was called and evaluated vitals which were normal Pt reports he has a hx of benign brain tumor and these symptoms are similar to when he had issues with this in the past He is unsure if he his head on Sunday and denies exertion or heat exposure over the weekend Will check CMP, CBC to rule out anemia, dehydration Will place neuro referral today to assist with monitoring for recurrence of tumor and rule out seizure activity  Order for MRI brain placed today to check for tumor recurrence and internal injury following fall- neuro exam reassuring at today's apt.  Follow up as indicated by labs and imaging results.

## 2021-11-30 NOTE — Patient Instructions (Addendum)
I am sending in a referral to neurology. The referral team should be contacting you soon to help schedule this apt

## 2021-11-30 NOTE — Assessment & Plan Note (Signed)
Reports he has maintained sobriety for the last 5 years but is concerned for impact this may have on body and overall health given recent LOC and hx of benign brain tumor.

## 2021-11-30 NOTE — Assessment & Plan Note (Signed)
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 Derrill Memo in Dec of 2012 Patient was known to have had seizure-like activity and isolated episodes of confusion prior to surgery Reports he has not followed up with [facility] for monitoring since this 2013.

## 2021-12-01 ENCOUNTER — Encounter: Payer: Self-pay | Admitting: Physician Assistant

## 2021-12-01 LAB — CBC WITH DIFFERENTIAL/PLATELET
Basophils Absolute: 0 10*3/uL (ref 0.0–0.2)
Basos: 1 %
EOS (ABSOLUTE): 0.1 10*3/uL (ref 0.0–0.4)
Eos: 2 %
Hematocrit: 43 % (ref 37.5–51.0)
Hemoglobin: 14.3 g/dL (ref 13.0–17.7)
Immature Grans (Abs): 0 10*3/uL (ref 0.0–0.1)
Immature Granulocytes: 1 %
Lymphocytes Absolute: 1.6 10*3/uL (ref 0.7–3.1)
Lymphs: 31 %
MCH: 31.4 pg (ref 26.6–33.0)
MCHC: 33.3 g/dL (ref 31.5–35.7)
MCV: 95 fL (ref 79–97)
Monocytes Absolute: 0.5 10*3/uL (ref 0.1–0.9)
Monocytes: 10 %
Neutrophils Absolute: 2.8 10*3/uL (ref 1.4–7.0)
Neutrophils: 55 %
Platelets: 315 10*3/uL (ref 150–450)
RBC: 4.55 x10E6/uL (ref 4.14–5.80)
RDW: 12.3 % (ref 11.6–15.4)
WBC: 5.1 10*3/uL (ref 3.4–10.8)

## 2021-12-01 LAB — COMPREHENSIVE METABOLIC PANEL
ALT: 48 IU/L — ABNORMAL HIGH (ref 0–44)
AST: 24 IU/L (ref 0–40)
Albumin/Globulin Ratio: 2.1 (ref 1.2–2.2)
Albumin: 4.8 g/dL (ref 4.1–5.1)
Alkaline Phosphatase: 69 IU/L (ref 44–121)
BUN/Creatinine Ratio: 15 (ref 9–20)
BUN: 16 mg/dL (ref 6–20)
Bilirubin Total: 0.3 mg/dL (ref 0.0–1.2)
CO2: 24 mmol/L (ref 20–29)
Calcium: 9.6 mg/dL (ref 8.7–10.2)
Chloride: 99 mmol/L (ref 96–106)
Creatinine, Ser: 1.08 mg/dL (ref 0.76–1.27)
Globulin, Total: 2.3 g/dL (ref 1.5–4.5)
Glucose: 90 mg/dL (ref 70–99)
Potassium: 4.4 mmol/L (ref 3.5–5.2)
Sodium: 138 mmol/L (ref 134–144)
Total Protein: 7.1 g/dL (ref 6.0–8.5)
eGFR: 91 mL/min/{1.73_m2} (ref >59)

## 2021-12-06 ENCOUNTER — Other Ambulatory Visit: Payer: Self-pay | Admitting: Physician Assistant

## 2021-12-06 DIAGNOSIS — R4020 Unspecified coma: Secondary | ICD-10-CM

## 2021-12-06 DIAGNOSIS — Z86011 Personal history of benign neoplasm of the brain: Secondary | ICD-10-CM

## 2021-12-07 ENCOUNTER — Ambulatory Visit: Payer: Managed Care, Other (non HMO)

## 2021-12-13 ENCOUNTER — Ambulatory Visit
Admission: RE | Admit: 2021-12-13 | Discharge: 2021-12-13 | Disposition: A | Discharge status: Home / Self Care | Payer: Managed Care, Other (non HMO) | Source: Ambulatory Visit | Attending: Physician Assistant | Admitting: Physician Assistant

## 2021-12-13 DIAGNOSIS — Z86011 Personal history of benign neoplasm of the brain: Secondary | ICD-10-CM

## 2021-12-13 DIAGNOSIS — R4020 Unspecified coma: Secondary | ICD-10-CM

## 2021-12-13 MED ORDER — GADOBENATE DIMEGLUMINE 529 MG/ML IV SOLN
20.0000 mL | Freq: Once | INTRAVENOUS | Status: AC | PRN
  Administered 2021-12-13: 20 mL via INTRAVENOUS

## 2021-12-16 NOTE — Progress Notes (Signed)
MRI brain was overall normal. No evidence of blockage, bleed or mass at this time. Please follow up with Neuro when you are able to schedule this for your loss of consciousness.

## 2022-02-13 DIAGNOSIS — G40909 Epilepsy, unspecified, not intractable, without status epilepticus: Secondary | ICD-10-CM | POA: Insufficient documentation

## 2022-02-13 DIAGNOSIS — E7800 Pure hypercholesterolemia, unspecified: Secondary | ICD-10-CM | POA: Insufficient documentation

## 2022-02-13 NOTE — Patient Instructions (Signed)
Seizure, Adult A seizure is a sudden burst of abnormal electrical and chemical activity in the brain. Seizures usually last from 30 seconds to 2 minutes.  What are the causes? Common causes of this condition include: Fever or infection. Problems that affect the brain. These may include: A brain or head injury. Bleeding in the brain. A brain tumor. Low levels of blood sugar or salt. Kidney problems or liver problems. Conditions that are passed from parent to child (are inherited). Problems with a substance, such as: Having a reaction to a drug or a medicine. Stopping the use of a substance all of a sudden (withdrawal). A stroke. Disorders that affect how you develop. Sometimes, the cause may not be known.  What increases the risk? Having someone in your family who has epilepsy. In this condition, seizures happen again and again over time. They have no clear cause. Having had a tonic-clonic seizure before. This type of seizure causes you to: Tighten the muscles of the whole body. Lose consciousness. Having had a head injury or strokes before. Having had a lack of oxygen at birth. What are the signs or symptoms? There are many types of seizures. The symptoms vary depending on the type of seizure you have. Symptoms during a seizure Shaking that you cannot control (convulsions) with fast, jerky movements of muscles. Stiffness of the body. Breathing problems. Feeling mixed up (confused). Staring or not responding to sound or touch. Head nodding. Eyes that blink, flutter, or move fast. Drooling, grunting, or making clicking sounds with your mouth Losing control of when you pee or poop. Symptoms before a seizure Feeling afraid, nervous, or worried. Feeling like you may vomit. Feeling like: You are moving when you are not. Things around you are moving when they are not. Feeling like you saw or heard something before (dj vu). Odd tastes or smells. Changes in how you see. You may  see flashing lights or spots. Symptoms after a seizure Feeling confused. Feeling sleepy. Headache. Sore muscles. How is this treated? If your seizure stops on its own, you will not need treatment. If your seizure lasts longer than 5 minutes, you will normally need treatment. Treatment may include: Medicines given through an IV tube. Avoiding things, such as medicines, that are known to cause your seizures. Medicines to prevent seizures. A device to prevent or control seizures. Surgery. A diet low in carbohydrates and high in fat (ketogenic diet). Follow these instructions at home: Medicines Take over-the-counter and prescription medicines only as told by your doctor. Avoid foods or drinks that may keep your medicine from working, such as alcohol. Activity Follow instructions about driving, swimming, or doing things that would be dangerous if you had another seizure. Wait until your doctor says it is safe for you to do these things. If you live in the [place] ask your local department of motor vehicles when you can drive. Get a lot of rest. Teaching others  Teach friends and family what to do when you have a seizure. They should: Help you get down to the ground. Protect your head and body. Loosen any clothing around your neck. Turn you on your side. Know whether or not you need emergency care. Stay with you until you are better. Also, tell them what not to do if you have a seizure. Tell them: They should not hold you down. They should not put anything in your mouth. General instructions Avoid anything that gives you seizures. Keep a seizure diary. Write down: What you remember   about each seizure. What you think caused each seizure. Keep all follow-up visits. Contact a doctor if: You have another seizure or seizures. Call the doctor each time you have a seizure. The pattern of your seizures changes. You keep having seizures with treatment. You have symptoms of being sick or  having an infection. You are not able to take your medicine. Get help right away if: You have any of these problems: A seizure that lasts longer than 5 minutes. Many seizures in a row and you do not feel better between seizures. A seizure that makes it harder to breathe. A seizure and you can no longer speak or use part of your body. You do not wake up right after a seizure. You get hurt during a seizure. You feel confused or have pain right after a seizure. These symptoms may be an emergency. Get help right away. Call your local emergency services (911 in the [place] Do not wait to see if the symptoms will go away. Do not drive yourself to the hospital. Summary A seizure is a sudden burst of abnormal electrical and chemical activity in the brain. Seizures normally last from 30 seconds to 2 minutes. Causes of seizures include illness, injury to the head, low levels of blood sugar or salt, and certain conditions. Most seizures will stop on their own in less than 5 minutes. Seizures that last longer than 5 minutes are a medical emergency and need treatment right away. Many medicines are used to treat seizures. Take over-the-counter and prescription medicines only as told by your doctor. This information is not intended to replace advice given to you by your health care provider. Make sure you discuss any questions you have with your health care provider. Document Revised: 10/25/2019 Document Reviewed: 10/25/2019 Elsevier Patient Education  2023 [employer] Inc[employer]  

## 2022-02-18 ENCOUNTER — Ambulatory Visit (INDEPENDENT_AMBULATORY_CARE_PROVIDER_SITE_OTHER): Payer: Managed Care, Other (non HMO) | Admitting: Nurse Practitioner

## 2022-02-18 ENCOUNTER — Encounter: Payer: Self-pay | Admitting: Nurse Practitioner

## 2022-02-18 VITALS — BP 107/67 | HR 78 | Temp 98.4°F | Ht 71.5 in | Wt 218.0 lb

## 2022-02-18 DIAGNOSIS — F4321 Adjustment disorder with depressed mood: Secondary | ICD-10-CM | POA: Diagnosis not present

## 2022-02-18 DIAGNOSIS — F1911 Other psychoactive substance abuse, in remission: Secondary | ICD-10-CM | POA: Diagnosis not present

## 2022-02-18 DIAGNOSIS — Z0000 Encounter for general adult medical examination without abnormal findings: Secondary | ICD-10-CM

## 2022-02-18 DIAGNOSIS — Z86011 Personal history of benign neoplasm of the brain: Secondary | ICD-10-CM

## 2022-02-18 DIAGNOSIS — Z720 Tobacco use: Secondary | ICD-10-CM

## 2022-02-18 DIAGNOSIS — G40909 Epilepsy, unspecified, not intractable, without status epilepticus: Secondary | ICD-10-CM

## 2022-02-18 DIAGNOSIS — Z23 Encounter for immunization: Secondary | ICD-10-CM

## 2022-02-18 DIAGNOSIS — E559 Vitamin D deficiency, unspecified: Secondary | ICD-10-CM

## 2022-02-18 DIAGNOSIS — E663 Overweight: Secondary | ICD-10-CM

## 2022-02-18 DIAGNOSIS — K219 Gastro-esophageal reflux disease without esophagitis: Secondary | ICD-10-CM

## 2022-02-18 DIAGNOSIS — E7800 Pure hypercholesterolemia, unspecified: Secondary | ICD-10-CM

## 2022-02-18 DIAGNOSIS — K449 Diaphragmatic hernia without obstruction or gangrene: Secondary | ICD-10-CM | POA: Insufficient documentation

## 2022-02-18 MED ORDER — PANTOPRAZOLE SODIUM 40 MG PO TBEC: 40.0000 mg | DELAYED_RELEASE_TABLET | Freq: Every day | ORAL | 4 refills | Status: DC

## 2022-02-18 NOTE — Assessment & Plan Note (Addendum)
New diagnosis August 2023 with history of ventricular choroid plexus papilloma resected in 2012.  Currently followed by [facility] neurology, will continue this collaboration and Lamictal as ordered.  Recent notes reviewed.

## 2022-02-18 NOTE — Assessment & Plan Note (Signed)
Recommend complete cessation of dipping.

## 2022-02-18 NOTE — Assessment & Plan Note (Signed)
Situational depression and anxiety at this time due to increased home stressors and new seizure diagnosis.  At this time wishes to maintain Lamictal as mood stabilizer and for seizures.  Does not want to add anything else at this time.  Working with Theme park manager at [employer].  Denies SI/HI.

## 2022-02-18 NOTE — Assessment & Plan Note (Signed)
Sober for >5 years, praised for this and recommend continued cessation.  Support on this journey.

## 2022-02-18 NOTE — Assessment & Plan Note (Signed)
BMI 29.98. Recommended eating smaller high protein, low fat meals more frequently and exercising 30 mins a day 5 times a week with a goal of 10-15lb weight loss in the next 3 months. Patient voiced their understanding and motivation to adhere to these recommendations.

## 2022-02-18 NOTE — Progress Notes (Signed)
BP 107/67   Pulse 78   Temp 98.4 F (36.9 C) (Oral)   Ht 5' 11.5" (1.816 m)   Wt 218 lb (98.9 kg)   SpO2 96%   BMI 29.98 kg/m    Subjective:    Patient ID: Thomas Sawyer, male    DOB: [birth year], 36 y.o.   MRN: [id number]  HPI: Thomas Sawyer is a 36 y.o. male presenting on 02/18/2022 for comprehensive medical examination. Current medical complaints include:none  He currently lives with: spouse Interim Problems from his last visit: no  GERD Continues on Protonix 40 MG daily.  Has hiatal hernia. GERD control status: stable Satisfied with current treatment? yes Heartburn frequency: none Medication side effects: no  Medication compliance: stable Dysphagia: no Odynophagia:  no Hematemesis: no Blood in stool: no EGD: yes  SEIZURE DISORDER: Followed by [place] neurology, last saw 02/04/22.  Was diagnosed August 2023, had a LOC episode at time -- fell out and his wife heard a loud bang.  Currently taking Lamictal XR 300 MG daily.  No further seizure activity.  Had recent testing, has not heard results yet.  No further LOC episodes.  No family history of seizures.  Had MRI which was normal.  He is not a smoker and denies any alcohol use.  Does dip tobacco.  Has history of brain tumor removal in 2011.  Depression Screen (increased score with seizure medication on board and stressors) -- has new baby on the way and truck messing up -- he wishes to stay on Lamictal mood stabilizer.  Is counseling with one of his pastors.    02/18/2022    2:02 PM 02/12/2021    2:02 PM 02/07/2020    1:16 PM 12/28/2018    2:52 PM 07/06/2015    3:04 PM  Depression screen PHQ 2/9  Decreased Interest 0 0 0 0 0  Down, Depressed, Hopeless 2 0 0 0 0  PHQ - 2 Score 2 0 0 0 0  Altered sleeping 3 0  1   Tired, decreased energy 2 0  1   Change in appetite 1 0  0   Feeling bad or failure about yourself  2 0  0   Trouble concentrating 2 0  1   Moving slowly or fidgety/restless 3 0  0   Suicidal thoughts 0 0  0    PHQ-9 Score 15 0  3   Difficult doing work/chores Not difficult at all Not difficult at all         02/18/2022    2:02 PM 02/12/2021    2:03 PM 12/28/2018    2:53 PM 12/22/2017    2:34 PM  GAD 7 : Generalized Anxiety Score  Nervous, Anxious, on Edge 2 2 0 3  Control/stop worrying 2 0 0 1  Worry too much - different things 3 0 1 1  Trouble relaxing '2 1 2 3  ' Restless 2 0 0 3  Easily annoyed or irritable '3 2 1 3  ' Afraid - awful might happen 0 0 1 2  Total GAD 7 Score '14 5 5 16  ' Anxiety Difficulty Not difficult at all Not difficult at all Not difficult at all       12/28/2018    2:52 PM 01/24/2020    4:26 PM 02/07/2020    1:15 PM 02/12/2021    2:15 PM 02/18/2022    1:59 PM  Fall Risk  Falls in the past year? 1  0 0 0  Was  there an injury with Fall? 1  0 0 0  Fall Risk Category Calculator 2  0 0 0  Fall Risk Category Moderate  Low Low Low  Patient Fall Risk Level Low fall risk Low fall risk Low fall risk Low fall risk Low fall risk  Patient at Risk for Falls Due to   No Fall Risks No Fall Risks No Fall Risks  Fall risk Follow up   Falls evaluation completed Falls prevention discussed Falls evaluation completed    Functional Status Survey: Is the patient deaf or have difficulty hearing?: No Does the patient have difficulty seeing, even when wearing glasses/contacts?: No Does the patient have difficulty concentrating, remembering, or making decisions?: No Does the patient have difficulty walking or climbing stairs?: No Does the patient have difficulty dressing or bathing?: No Does the patient have difficulty doing errands alone such as visiting a doctor's office or shopping?: No   Past Medical History:  Past Medical History:  Diagnosis Date   ADHD (attention deficit hyperactivity disorder)    Anxiety    Depression    Depression    Genital warts    Hiatal hernia    Oppositional defiant behavior     Surgical History:  Past Surgical History:  Procedure Laterality Date    BRAIN SURGERY  05/02/2009   tumor removal    Medications:  Current Outpatient Medications on File Prior to Visit  Medication Sig   lamoTRIgine (LAMICTAL XR) 100 MG 24 hour tablet Take 3 tablets by mouth daily.   valACYclovir (VALTREX) 1000 MG tablet Take 1 tablet (1,000 mg total) by mouth 2 (two) times daily as needed.   No current facility-administered medications on file prior to visit.    Allergies:  No Known Allergies  Social History:  Social History   Socioeconomic History   Marital status: Married    Spouse name: Not on file   Number of children: Not on file   Years of education: Not on file   Highest education level: Not on file  Occupational History   Not on file  Tobacco Use   Smoking status: Former    Packs/day: 0.50    Years: 18.00    Total pack years: 9.00    Types: Cigarettes   Smokeless tobacco: Current    Types: Snuff  Vaping Use   Vaping Use: Former  Substance and Sexual Activity   Alcohol use: Not Currently    Comment: rare   Drug use: Not Currently    Types: Marijuana   Sexual activity: Not on file  Other Topics Concern   Not on file  Social History Narrative   Not on file   Social Determinants of Health   Financial Resource Strain: Low Risk  (02/12/2021)   Overall Financial Resource Strain (CARDIA)    Difficulty of Paying Living Expenses: Not hard at all  Food Insecurity: No Food Insecurity (02/12/2021)   Hunger Vital Sign    Worried About Running Out of Food in the Last Year: Never true    [place] in the Last Year: Never true  Transportation Needs: No Transportation Needs (02/12/2021)   PRAPARE - Hydrologist (Medical): No    Lack of Transportation (Non-Medical): No  Physical Activity: Sufficiently Active (02/12/2021)   Exercise Vital Sign    Days of Exercise per Week: 5 days    Minutes of Exercise per Session: 40 min  Stress: No Stress Concern Present (02/12/2021)   [employer]  of  Occupational Health - Occupational Stress Questionnaire    Feeling of Stress : Not at all  Social Connections: Moderately Isolated (02/12/2021)   Social Connection and Isolation Panel [NHANES]    Frequency of Communication with Friends and Family: More than three times a week    Frequency of Social Gatherings with Friends and Family: More than three times a week    Attends Religious Services: Never    Marine scientist or Organizations: No    Attends Archivist Meetings: Never    Marital Status: Married  Human resources officer Violence: Not At Risk (02/12/2021)   Humiliation, Afraid, Rape, and Kick questionnaire    Fear of Current or Ex-Partner: No    Emotionally Abused: No    Physically Abused: No    Sexually Abused: No   Social History   Tobacco Use  Smoking Status Former   Packs/day: 0.50   Years: 18.00   Total pack years: 9.00   Types: Cigarettes  Smokeless Tobacco Current   Types: Snuff   Social History   Substance and Sexual Activity  Alcohol Use Not Currently   Comment: rare    Family History:  Family History  Problem Relation Age of Onset   Asthma Father    Diabetes Father    Cancer - Lung Father    Cancer Neg Hx    COPD Neg Hx    Heart disease Neg Hx    Hypertension Neg Hx    Stroke Neg Hx    Prostate cancer Neg Hx    Sickle cell trait Neg Hx    Bladder Cancer Neg Hx    Kidney cancer Neg Hx     Past medical history, surgical history, medications, allergies, family history and social history reviewed with patient today and changes made to appropriate areas of the chart.   Review of Systems - negative All other ROS negative except what is listed above and in the HPI.      Objective:    BP 107/67   Pulse 78   Temp 98.4 F (36.9 C) (Oral)   Ht 5' 11.5" (1.816 m)   Wt 218 lb (98.9 kg)   SpO2 96%   BMI 29.98 kg/m   Wt Readings from Last 3 Encounters:  02/18/22 218 lb (98.9 kg)  11/30/21 219 lb (99.3 kg)  02/26/21 213 lb (96.6 kg)     Physical Exam Vitals and nursing note reviewed.  Constitutional:      General: He is awake. He is not in acute distress.    Appearance: He is well-developed and well-groomed. He is not ill-appearing.  HENT:     Head: Normocephalic and atraumatic.     Right Ear: Hearing, tympanic membrane, ear canal and external ear normal. No drainage.     Left Ear: Hearing, tympanic membrane, ear canal and external ear normal. No drainage.     Nose: Nose normal.     Mouth/Throat:     Pharynx: Uvula midline.  Eyes:     General: Lids are normal.        Right eye: No discharge.        Left eye: No discharge.     Extraocular Movements: Extraocular movements intact.     Conjunctiva/sclera: Conjunctivae normal.     Pupils: Pupils are equal, round, and reactive to light.     Visual Fields: Right eye visual fields normal and left eye visual fields normal.  Neck:     Thyroid: No thyromegaly.  Vascular: No carotid bruit or JVD.     Trachea: Trachea normal.  Cardiovascular:     Rate and Rhythm: Normal rate and regular rhythm.     Heart sounds: Normal heart sounds, S1 normal and S2 normal. No murmur heard.    No gallop.  Pulmonary:     Effort: Pulmonary effort is normal. No accessory muscle usage or respiratory distress.     Breath sounds: Normal breath sounds.  Abdominal:     General: Bowel sounds are normal.     Palpations: Abdomen is soft. There is no hepatomegaly or splenomegaly.     Tenderness: There is no abdominal tenderness.  Musculoskeletal:        General: Normal range of motion.     Cervical back: Normal range of motion and neck supple.     Right lower leg: No edema.     Left lower leg: No edema.  Lymphadenopathy:     Head:     Right side of head: No submental, submandibular, tonsillar, preauricular or posterior auricular adenopathy.     Left side of head: No submental, submandibular, tonsillar, preauricular or posterior auricular adenopathy.     Cervical: No cervical adenopathy.   Skin:    General: Skin is warm and dry.     Capillary Refill: Capillary refill takes less than 2 seconds.     Findings: No rash.     Comments: Multiple tattoos and piercing's present.  Neurological:     Mental Status: He is alert and oriented to person, place, and time.     Gait: Gait is intact.     Deep Tendon Reflexes: Reflexes are normal and symmetric.     Reflex Scores:      Brachioradialis reflexes are 2+ on the right side and 2+ on the left side.      Patellar reflexes are 2+ on the right side and 2+ on the left side. Psychiatric:        Attention and Perception: Attention normal.        Mood and Affect: Mood normal.        Speech: Speech normal.        Behavior: Behavior normal. Behavior is cooperative.        Thought Content: Thought content normal.        Cognition and Memory: Cognition normal.        Judgment: Judgment normal.    Results for orders placed or performed in visit on 11/30/21  Comp Met (CMET)  Result Value Ref Range   Glucose 90 70 - 99 mg/dL   BUN 16 6 - 20 mg/dL   Creatinine, Ser 1.08 0.76 - 1.27 mg/dL   eGFR 91 >59 mL/min/1.73   BUN/Creatinine Ratio 15 9 - 20   Sodium 138 134 - 144 mmol/L   Potassium 4.4 3.5 - 5.2 mmol/L   Chloride 99 96 - 106 mmol/L   CO2 24 20 - 29 mmol/L   Calcium 9.6 8.7 - 10.2 mg/dL   Total Protein 7.1 6.0 - 8.5 g/dL   Albumin 4.8 4.1 - 5.1 g/dL   Globulin, Total 2.3 1.5 - 4.5 g/dL   Albumin/Globulin Ratio 2.1 1.2 - 2.2   Bilirubin Total 0.3 0.0 - 1.2 mg/dL   Alkaline Phosphatase 69 44 - 121 IU/L   AST 24 0 - 40 IU/L   ALT 48 (H) 0 - 44 IU/L  CBC w/Diff  Result Value Ref Range   WBC 5.1 3.4 - 10.8 x10E3/uL   RBC  4.55 4.14 - 5.80 x10E6/uL   Hemoglobin 14.3 13.0 - 17.7 g/dL   Hematocrit 43.0 37.5 - 51.0 %   MCV 95 79 - 97 fL   MCH 31.4 26.6 - 33.0 pg   MCHC 33.3 31.5 - 35.7 g/dL   RDW 12.3 11.6 - 15.4 %   Platelets 315 150 - 450 x10E3/uL   Neutrophils 55 Not Estab. %   Lymphs 31 Not Estab. %   Monocytes 10 Not  Estab. %   Eos 2 Not Estab. %   Basos 1 Not Estab. %   Neutrophils Absolute 2.8 1.4 - 7.0 x10E3/uL   Lymphocytes Absolute 1.6 0.7 - 3.1 x10E3/uL   Monocytes Absolute 0.5 0.1 - 0.9 x10E3/uL   EOS (ABSOLUTE) 0.1 0.0 - 0.4 x10E3/uL   Basophils Absolute 0.0 0.0 - 0.2 x10E3/uL   Immature Granulocytes 1 Not Estab. %   Immature Grans (Abs) 0.0 0.0 - 0.1 x10E3/uL      Assessment & Plan:   Problem List Items Addressed This Visit       Digestive   Gastroesophageal reflux disease    Chronic, ongoing with hiatal hernia in past noted on EGD.  Continue Protonix daily.  Refills sent in.  Risks of PPI use were discussed with patient including bone loss, C. Diff diarrhea, pneumonia, infections, CKD, electrolyte abnormalities.  Verbalizes understanding and chooses to continue the medication.  Mag level today.      Relevant Medications   pantoprazole (PROTONIX) 40 MG tablet   Other Relevant Orders   Magnesium     Nervous and Auditory   Seizure disorder [facility]) - Primary    New diagnosis August 2023 with history of ventricular choroid plexus papilloma resected in 2012.  Currently followed by [facility] neurology, will continue this collaboration and Lamictal as ordered.  Recent notes reviewed.      Relevant Medications   lamoTRIgine (LAMICTAL XR) 100 MG 24 hour tablet   Other Relevant Orders   CBC with Differential/Platelet   Comprehensive metabolic panel   TSH   Vitamin B12     Other   Chewing tobacco use    Recommend complete cessation of dipping.      Elevated low density lipoprotein (LDL) cholesterol level    Noted on past labs, recheck CMP and lipid panel today.  Continue focus on diet and regular activity.      Relevant Orders   Comprehensive metabolic panel   Lipid Panel w/o Chol/HDL Ratio   Hx of substance abuse ([place])    Sober for >5 years, praised for this and recommend continued cessation.  Support on this journey.      Relevant Orders   Comprehensive metabolic panel   TSH    Overweight (BMI 25.0-29.9)    BMI 29.98. Recommended eating smaller high protein, low fat meals more frequently and exercising 30 mins a day 5 times a week with a goal of 10-15lb weight loss in the next 3 months. Patient voiced their understanding and motivation to adhere to these recommendations.       Personal history of benign brain tumor    History of, continue collaboration at this time with [facility] neurology.  Recent note reviewed.      Relevant Orders   Comprehensive metabolic panel   TSH   Situational depression    Situational depression and anxiety at this time due to increased home stressors and new seizure diagnosis.  At this time wishes to maintain Lamictal as mood stabilizer and for seizures.  Does  not want to add anything else at this time.  Working with Theme park manager at [employer].  Denies SI/HI.      Other Visit Diagnoses     Vitamin D deficiency       Vitamin D check on labs today, start supplement as needed.   Relevant Orders   VITAMIN D 25 Hydroxy (Vit-D Deficiency, Fractures)   Encounter for annual physical exam       Annual physical today with labs and health maintenance reviewed, discussed with patient.        LABORATORY TESTING:  Health maintenance labs ordered today as discussed above.   IMMUNIZATIONS:   - Tdap: Tetanus vaccination status reviewed: last tetanus booster within 10 years. - Influenza: refuses - Pneumovax: Not applicable - Prevnar: Not applicable - Zostavax vaccine: Not applicable - Covid -- took in 2021  SCREENING: - Colonoscopy: Not applicable  Discussed with patient purpose of the colonoscopy is to detect colon cancer at curable precancerous or early stages   - AAA Screening: Not applicable  -Hearing Test: Not applicable  -Spirometry: Not applicable   PATIENT COUNSELING:    Sexuality: Discussed sexually transmitted diseases, partner selection, use of condoms, avoidance of unintended pregnancy  and contraceptive alternatives.   Advised to  avoid cigarette smoking.  I discussed with the patient that most people either abstain from alcohol or drink within safe limits (<=14/week and <=4 drinks/occasion for males, <=7/weeks and <= 3 drinks/occasion for females) and that the risk for alcohol disorders and other health effects rises proportionally with the number of drinks per week and how often a drinker exceeds daily limits.  Discussed cessation/primary prevention of drug use and availability of treatment for abuse.   Diet: Encouraged to adjust caloric intake to maintain  or achieve ideal body weight, to reduce intake of dietary saturated fat and total fat, to limit sodium intake by avoiding high sodium foods and not adding table salt, and to maintain adequate dietary potassium and calcium preferably from fresh fruits, vegetables, and low-fat dairy products.    Stressed the importance of regular exercise  Injury prevention: Discussed safety belts, safety helmets, smoke detector, smoking near bedding or upholstery.   Dental health: Discussed importance of regular tooth brushing, flossing, and dental visits.   Follow up plan: NEXT PREVENTATIVE PHYSICAL DUE IN 1 YEAR. Return in about 6 months (around 08/20/2022) for SEIZURES and GERD + MOOD.

## 2022-02-18 NOTE — Assessment & Plan Note (Signed)
Chronic, ongoing with hiatal hernia in past noted on EGD.  Continue Protonix daily.  Refills sent in.  Risks of PPI use were discussed with patient including bone loss, C. Diff diarrhea, pneumonia, infections, CKD, electrolyte abnormalities.  Verbalizes understanding and chooses to continue the medication.  Mag level today.

## 2022-02-18 NOTE — Assessment & Plan Note (Signed)
Noted on past labs, recheck CMP and lipid panel today.  Continue focus on diet and regular activity.

## 2022-02-18 NOTE — Assessment & Plan Note (Signed)
History of, continue collaboration at this time with [facility] neurology.  Recent note reviewed.

## 2022-02-19 LAB — CBC WITH DIFFERENTIAL/PLATELET
Basophils Absolute: 0 10*3/uL (ref 0.0–0.2)
Basos: 1 %
EOS (ABSOLUTE): 0.1 10*3/uL (ref 0.0–0.4)
Eos: 2 %
Hematocrit: 40.9 % (ref 37.5–51.0)
Hemoglobin: 14 g/dL (ref 13.0–17.7)
Immature Grans (Abs): 0 10*3/uL (ref 0.0–0.1)
Immature Granulocytes: 0 %
Lymphocytes Absolute: 1.9 10*3/uL (ref 0.7–3.1)
Lymphs: 33 %
MCH: 31.9 pg (ref 26.6–33.0)
MCHC: 34.2 g/dL (ref 31.5–35.7)
MCV: 93 fL (ref 79–97)
Monocytes Absolute: 0.4 10*3/uL (ref 0.1–0.9)
Monocytes: 7 %
Neutrophils Absolute: 3.3 10*3/uL (ref 1.4–7.0)
Neutrophils: 57 %
Platelets: 311 10*3/uL (ref 150–450)
RBC: 4.39 x10E6/uL (ref 4.14–5.80)
RDW: 12.2 % (ref 11.6–15.4)
WBC: 5.8 10*3/uL (ref 3.4–10.8)

## 2022-02-19 LAB — LIPID PANEL W/O CHOL/HDL RATIO
Cholesterol, Total: 232 mg/dL — ABNORMAL HIGH (ref 100–199)
HDL: 39 mg/dL — ABNORMAL LOW (ref >39)
LDL Chol Calc (NIH): 182 mg/dL — ABNORMAL HIGH (ref 0–99)
Triglycerides: 66 mg/dL (ref 0–149)
VLDL Cholesterol Cal: 11 mg/dL (ref 5–40)

## 2022-02-19 LAB — COMPREHENSIVE METABOLIC PANEL
ALT: 28 IU/L (ref 0–44)
AST: 20 IU/L (ref 0–40)
Albumin/Globulin Ratio: 2.6 — ABNORMAL HIGH (ref 1.2–2.2)
Albumin: 5.1 g/dL (ref 4.1–5.1)
Alkaline Phosphatase: 66 IU/L (ref 44–121)
BUN/Creatinine Ratio: 15 (ref 9–20)
BUN: 16 mg/dL (ref 6–20)
Bilirubin Total: 0.4 mg/dL (ref 0.0–1.2)
CO2: 22 mmol/L (ref 20–29)
Calcium: 9.6 mg/dL (ref 8.7–10.2)
Chloride: 102 mmol/L (ref 96–106)
Creatinine, Ser: 1.05 mg/dL (ref 0.76–1.27)
Globulin, Total: 2 g/dL (ref 1.5–4.5)
Glucose: 86 mg/dL (ref 70–99)
Potassium: 4 mmol/L (ref 3.5–5.2)
Sodium: 140 mmol/L (ref 134–144)
Total Protein: 7.1 g/dL (ref 6.0–8.5)
eGFR: 93 mL/min/{1.73_m2} (ref >59)

## 2022-02-19 LAB — VITAMIN D 25 HYDROXY (VIT D DEFICIENCY, FRACTURES): Vit D, 25-Hydroxy: 28.7 ng/mL — ABNORMAL LOW (ref 30.0–100.0)

## 2022-02-19 LAB — MAGNESIUM: Magnesium: 2.2 mg/dL (ref 1.6–2.3)

## 2022-02-19 LAB — TSH: TSH: 0.674 u[IU]/mL (ref 0.450–4.500)

## 2022-02-19 LAB — VITAMIN B12: Vitamin B-12: 607 pg/mL (ref 232–1245)

## 2022-02-20 ENCOUNTER — Encounter: Payer: Self-pay | Admitting: Nurse Practitioner

## 2022-02-20 DIAGNOSIS — E559 Vitamin D deficiency, unspecified: Secondary | ICD-10-CM | POA: Insufficient documentation

## 2022-02-20 NOTE — Progress Notes (Signed)
Contacted via MyChart  Good morning Thomas Sawyer, your labs have returned: - Kidney function, creatinine and eGFR, remains normal, as is liver function, AST and ALT.  - Vitamin D level a little low, recommend you start Vitamin D3 2000 units daily for overall bone health.  You can obtain this over the counter. - CBC, B12, Magnesium, and Thyroid (TSH) are all normal. - Your cholesterol is still high, but continued recommendations to make lifestyle changes -- levels are trending up which is concerning for more genetic element. Your LDL is above normal. The LDL is the bad cholesterol. Over time and in combination with inflammation and other factors, this contributes to plaque which in turn may lead to stroke and/or heart attack down the road. Sometimes high LDL is primarily genetic, and people might be eating all the right foods but still have high numbers. Other times, there is room for improvement in one's diet and eating healthier can bring this number down and potentially reduce one's risk of heart attack and/or stroke.   To reduce your LDL, Remember - more fruits and vegetables, more fish, and limit red meat and dairy products. More soy, nuts, beans, barley, lentils, oats and plant sterol ester enriched margarine instead of butter. I also encourage eliminating sugar and processed food. Remember, shop on the outside of the grocery store and visit your Farmer's Market. If you would like to talk with me about dietary changes for your cholesterol, please let me know. We should recheck your cholesterol in 6 months.  Please come fasting to next visit.  Any questions? Keep being amazing!!  Thank you for allowing me to participate in your care.  I appreciate you. Kindest regards, Thomas Sawyer    

## 2022-05-28 NOTE — Patient Instructions (Signed)
Vasectomy Vasectomy is a procedure in which the vas deferens is cut and then tied or burned (cauterized). The vas deferens is a tube that carries sperm from the testicle to the part of the body that drains urine from the bladder (urethra). This procedure blocks sperm from going through the vas deferens and penis during ejaculation. This ensures that sperm does not go into the vagina during sex. Vasectomy does not affect sexual desire or performance and does not prevent sexually transmitted infections. Vasectomy is considered a permanent and very effective form of birth control (contraception). The decision to have a vasectomy should not be made during a stressful time, such as after the loss of a pregnancy or a divorce. You and your partner should decide on whether to have a vasectomy when you are sure that you do not want children in the future. Tell a health care provider about: Any allergies you have. All medicines you are taking, including vitamins, herbs, eye drops, creams, and over-the-counter medicines. Any problems you or family members have had with anesthetic medicines. Any blood disorders you have. Any surgeries you have had. Any medical conditions you have. What are the risks? Generally, this is a safe procedure. However, problems may occur, including: Infection. Bleeding and swelling of the scrotum. The scrotum is the sac that contains the testicles, blood vessels, and structures that help deliver sperm and semen. Allergic reactions to medicines. Failure of the procedure to prevent pregnancy. There is a very small chance that the tied or cauterized ends of the vas deferens may reconnect (recanalization). If this happens, you could still make a woman pregnant. Pain in the scrotum that continues after you heal from the procedure. What happens before the procedure? Medicines Ask your health care provider about: Changing or stopping your regular medicines. This is especially important if  you are taking diabetes medicines or blood thinners. Taking medicines such as aspirin and ibuprofen. These medicines can thin your blood. Do not take these medicines unless your health care provider tells you to take them. Taking over-the-counter medicines, vitamins, herbs, and supplements. You may be told to take a medicine to help you relax (sedative) a few hours before the procedure. General instructions Do not use any products that contain nicotine or tobacco for at least 4 weeks before the procedure. These products include cigarettes, e-cigarettes, and chewing tobacco. If you need help quitting, ask your health care provider. Plan to have a responsible adult take you home from the hospital or clinic. If you will be going home right after the procedure, plan to have a responsible adult care for you for the time you are told. This is important. Ask your health care provider: How your surgery site will be marked. What steps will be taken to help prevent infection. These steps may include: Removing hair at the surgery site. Washing skin with a germ-killing soap. Taking antibiotic medicine. What happens during the procedure?  You will be given one or more of the following: A sedative, unless you were told to take this a few hours before the procedure. A medicine to numb the area (local anesthetic). Your health care provider will feel, or palpate, for your vas deferens. To reach the vas deferens, one of two methods may be used: A very small incision may be made in your scrotum. A punctured opening may be made in your scrotum, without an incision. Your vas deferens will be pulled out of your scrotum and cut. Then, the vas deferens will be closed   in one of two ways: Tied at the ends. Cauterized at the ends to seal them off. The vas deferens will be put back into your scrotum. The incision or puncture opening will be closed with absorbable stitches (sutures). The sutures will eventually  dissolve and will not need to be removed after the procedure. The procedure will be repeated on the other side of your scrotum. The procedure may vary among health care providers and hospitals. What happens after the procedure? You will be monitored to make sure that you do not have problems. You will be asked not to ejaculate for at least 1 week after the procedure, or for as long as you are told. You will need to use a different form of contraception for 2-4 months after the procedure, until you have test results confirming that there are no sperm in your semen. You may be given scrotal support to wear, such as a jockstrap or underwear with a supportive pouch. If you were given a sedative during the procedure, it can affect you for several hours. Do not drive or operate machinery until your health care provider says that it is safe. Summary Vasectomy blocks sperm from being released during ejaculation. This procedure is considered a permanent and very effective form of birth control. Your scrotum will be numbed with medicine (local anesthetic) for the procedure. After the procedure, you will be asked not to ejaculate for at least 1 week, or for as long as you are told. You will also need to use a different form of contraception until your test results confirm that there are no sperm in your semen. This information is not intended to replace advice given to you by your health care provider. Make sure you discuss any questions you have with your health care provider. Document Revised: 09/05/2019 Document Reviewed: 09/05/2019 Elsevier Patient Education  2023 [employer]  

## 2022-06-01 ENCOUNTER — Ambulatory Visit: Payer: Managed Care, Other (non HMO) | Admitting: Nurse Practitioner

## 2022-06-01 ENCOUNTER — Encounter: Payer: Self-pay | Admitting: Nurse Practitioner

## 2022-06-01 VITALS — BP 126/83 | HR 88 | Temp 98.4°F | Ht 71.5 in | Wt 229.2 lb

## 2022-06-01 DIAGNOSIS — Z6830 Body mass index (BMI) 30.0-30.9, adult: Secondary | ICD-10-CM

## 2022-06-01 DIAGNOSIS — E6609 Other obesity due to excess calories: Secondary | ICD-10-CM | POA: Diagnosis not present

## 2022-06-01 DIAGNOSIS — Z3009 Encounter for other general counseling and advice on contraception: Secondary | ICD-10-CM

## 2022-06-01 NOTE — Progress Notes (Signed)
BP 126/83   Pulse 88   Temp 98.4 F (36.9 C) (Oral)   Ht 5' 11.5" (1.816 m)   Wt 229 lb 3.2 oz (104 kg)   SpO2 96%   BMI 31.52 kg/m    Subjective:    Patient ID: Thomas Sawyer, male    DOB: Oct 23, 1985, 37 y.o.   MRN: [id number]  HPI: Thomas Sawyer is a 37 y.o. male  Chief Complaint  Patient presents with   Referral    Referral for Urology   VASECTOMY REFERRAL:  Has 3 children, new one is a week old.  He reports he does not want anymore children, wife is in agreeance.  Has seen [facility] urology in past and would like to return to see Dr. Erlene Quan.  Has full understanding of procedure and process.  Relevant past medical, surgical, family and social history reviewed and updated as indicated. Interim medical history since our last visit reviewed. Allergies and medications reviewed and updated.  Review of Systems  Constitutional:  Negative for activity change, diaphoresis, fatigue and fever.  Respiratory:  Negative for cough, chest tightness, shortness of breath and wheezing.   Cardiovascular:  Negative for chest pain, palpitations and leg swelling.  Neurological: Negative.   Psychiatric/Behavioral: Negative.      Per HPI unless specifically indicated above     Objective:    BP 126/83   Pulse 88   Temp 98.4 F (36.9 C) (Oral)   Ht 5' 11.5" (1.816 m)   Wt 229 lb 3.2 oz (104 kg)   SpO2 96%   BMI 31.52 kg/m   Wt Readings from Last 3 Encounters:  06/01/22 229 lb 3.2 oz (104 kg)  02/18/22 218 lb (98.9 kg)  11/30/21 219 lb (99.3 kg)    Physical Exam Vitals and nursing note reviewed.  Constitutional:      General: He is awake. He is not in acute distress.    Appearance: He is well-developed and well-groomed. He is not ill-appearing or toxic-appearing.  HENT:     Head: Normocephalic.  Eyes:     General: Lids are normal.     Extraocular Movements: Extraocular movements intact.     Conjunctiva/sclera: Conjunctivae normal.  Neck:     Thyroid: No thyromegaly.      Vascular: No carotid bruit.  Cardiovascular:     Rate and Rhythm: Normal rate and regular rhythm.     Heart sounds: Normal heart sounds.  Pulmonary:     Effort: No accessory muscle usage or respiratory distress.     Breath sounds: Normal breath sounds.  Abdominal:     General: Bowel sounds are normal. There is no distension.     Palpations: Abdomen is soft.     Tenderness: There is no abdominal tenderness.  Musculoskeletal:     Cervical back: Full passive range of motion without pain.     Right lower leg: No edema.     Left lower leg: No edema.  Lymphadenopathy:     Cervical: No cervical adenopathy.  Skin:    General: Skin is warm.     Capillary Refill: Capillary refill takes less than 2 seconds.  Neurological:     Mental Status: He is alert and oriented to person, place, and time.     Deep Tendon Reflexes: Reflexes are normal and symmetric.     Reflex Scores:      Brachioradialis reflexes are 2+ on the right side and 2+ on the left side.  Patellar reflexes are 2+ on the right side and 2+ on the left side. Psychiatric:        Attention and Perception: Attention normal.        Mood and Affect: Mood normal.        Speech: Speech normal.        Behavior: Behavior normal. Behavior is cooperative.        Thought Content: Thought content normal.     Results for orders placed or performed in visit on 02/18/22  CBC with Differential/Platelet  Result Value Ref Range   WBC 5.8 3.4 - 10.8 x10E3/uL   RBC 4.39 4.14 - 5.80 x10E6/uL   Hemoglobin 14.0 13.0 - 17.7 g/dL   Hematocrit 40.9 37.5 - 51.0 %   MCV 93 79 - 97 fL   MCH 31.9 26.6 - 33.0 pg   MCHC 34.2 31.5 - 35.7 g/dL   RDW 12.2 11.6 - 15.4 %   Platelets 311 150 - 450 x10E3/uL   Neutrophils 57 Not Estab. %   Lymphs 33 Not Estab. %   Monocytes 7 Not Estab. %   Eos 2 Not Estab. %   Basos 1 Not Estab. %   Neutrophils Absolute 3.3 1.4 - 7.0 x10E3/uL   Lymphocytes Absolute 1.9 0.7 - 3.1 x10E3/uL   Monocytes Absolute 0.4 0.1  - 0.9 x10E3/uL   EOS (ABSOLUTE) 0.1 0.0 - 0.4 x10E3/uL   Basophils Absolute 0.0 0.0 - 0.2 x10E3/uL   Immature Granulocytes 0 Not Estab. %   Immature Grans (Abs) 0.0 0.0 - 0.1 x10E3/uL  Comprehensive metabolic panel  Result Value Ref Range   Glucose 86 70 - 99 mg/dL   BUN 16 6 - 20 mg/dL   Creatinine, Ser 1.05 0.76 - 1.27 mg/dL   eGFR 93 >59 mL/min/1.73   BUN/Creatinine Ratio 15 9 - 20   Sodium 140 134 - 144 mmol/L   Potassium 4.0 3.5 - 5.2 mmol/L   Chloride 102 96 - 106 mmol/L   CO2 22 20 - 29 mmol/L   Calcium 9.6 8.7 - 10.2 mg/dL   Total Protein 7.1 6.0 - 8.5 g/dL   Albumin 5.1 4.1 - 5.1 g/dL   Globulin, Total 2.0 1.5 - 4.5 g/dL   Albumin/Globulin Ratio 2.6 (H) 1.2 - 2.2   Bilirubin Total 0.4 0.0 - 1.2 mg/dL   Alkaline Phosphatase 66 44 - 121 IU/L   AST 20 0 - 40 IU/L   ALT 28 0 - 44 IU/L  TSH  Result Value Ref Range   TSH 0.674 0.450 - 4.500 uIU/mL  Lipid Panel w/o Chol/HDL Ratio  Result Value Ref Range   Cholesterol, Total 232 (H) 100 - 199 mg/dL   Triglycerides 66 0 - 149 mg/dL   HDL 39 (L) >39 mg/dL   VLDL Cholesterol Cal 11 5 - 40 mg/dL   LDL Chol Calc (NIH) 182 (H) 0 - 99 mg/dL  Vitamin B12  Result Value Ref Range   Vitamin B-12 607 232 - 1,245 pg/mL  VITAMIN D 25 Hydroxy (Vit-D Deficiency, Fractures)  Result Value Ref Range   Vit D, 25-Hydroxy 28.7 (L) 30.0 - 100.0 ng/mL  Magnesium  Result Value Ref Range   Magnesium 2.2 1.6 - 2.3 mg/dL      Assessment & Plan:   Problem List Items Addressed This Visit       Other   Obesity - Primary    BMI 31.52. Recommended eating smaller high protein, low fat meals more frequently and exercising 30  mins a day 5 times a week with a goal of 10-15lb weight loss in the next 3 months. Patient voiced their understanding and motivation to adhere to these recommendations.        Other Visit Diagnoses     Vasectomy evaluation       Referral to urology placed per patient request.   Relevant Orders   Ambulatory referral  to Urology        Follow up plan: Return if symptoms worsen or fail to improve.

## 2022-06-01 NOTE — Assessment & Plan Note (Signed)
BMI 31.52. Recommended eating smaller high protein, low fat meals more frequently and exercising 30 mins a day 5 times a week with a goal of 10-15lb weight loss in the next 3 months. Patient voiced their understanding and motivation to adhere to these recommendations.

## 2022-07-01 ENCOUNTER — Ambulatory Visit: Payer: Managed Care, Other (non HMO) | Admitting: Urology

## 2022-07-01 VITALS — BP 127/82 | HR 84 | Ht 72.0 in | Wt 224.5 lb

## 2022-07-01 DIAGNOSIS — Z3009 Encounter for other general counseling and advice on contraception: Secondary | ICD-10-CM | POA: Diagnosis not present

## 2022-07-01 NOTE — Progress Notes (Signed)
07/01/2022 10:39 AM   Thomas Sawyer 05/12/1985 [id number]  Referring provider: Venita Lick, NP [address] [place],  [place] [address]  Chief Complaint  Patient presents with   VAS Consult    HPI: 37 y.o. year old male referred for further evaluation of possible vasectomy.  He does have a baseline history of chronic right testicular pain, please see previous notes for details.  He was last seen in 2022 for this issue.  He reports that he worked with physical therapy and continues to do these exercises to strengthen his pelvic floor muscles which have essentially completely resolved all of his GU/testicular issues.  PMH: Past Medical History:  Diagnosis Date   ADHD (attention deficit hyperactivity disorder)    Anxiety    Depression    Depression    Genital warts    Hiatal hernia    Oppositional defiant behavior     Surgical History: Past Surgical History:  Procedure Laterality Date   BRAIN SURGERY  05/02/2009   tumor removal    Home Medications:  Allergies as of 07/01/2022   No Known Allergies      Medication List        Accurate as of July 01, 2022 10:39 AM. If you have any questions, ask your nurse or doctor.          lamoTRIgine 100 MG 24 hour tablet Commonly known as: LAMICTAL XR Take 3 tablets by mouth daily.   pantoprazole 40 MG tablet Commonly known as: PROTONIX Take 1 tablet (40 mg total) by mouth daily.   valACYclovir 1000 MG tablet Commonly known as: VALTREX Take 1 tablet (1,000 mg total) by mouth 2 (two) times daily as needed.        Allergies: No Known Allergies  Family History: Family History  Problem Relation Age of Onset   Asthma Father    Diabetes Father    Cancer - Lung Father    Cancer Neg Hx    COPD Neg Hx    Heart disease Neg Hx    Hypertension Neg Hx    Stroke Neg Hx    Prostate cancer Neg Hx    Sickle cell trait Neg Hx    Bladder Cancer Neg Hx    Kidney cancer Neg Hx     Social History:  reports that he has  quit smoking. His smoking use included cigarettes. He has a 9.00 pack-year smoking history. His smokeless tobacco use includes snuff. He reports that he does not currently use alcohol. He reports that he does not currently use drugs after having used the following drugs: Marijuana.   Physical Exam: BP 127/82   Pulse 84   Ht 6' (1.829 m)   Wt 224 lb 8 oz (101.8 kg)   BMI 30.45 kg/m   Constitutional:  Alert and oriented, No acute distress. HEENT: [place] AT, moist mucus membranes.  Trachea midline, no masses. Cardiovascular: No clubbing, cyanosis, or edema. Respiratory: Normal respiratory effort, no increased work of breathing. GI: Abdomen is soft, nontender, nondistended, no abdominal masses GU: Normal phallus.  Bilateral descended testicles without masses.  Vasa easily palpable bilaterally. Skin: No rashes, bruises or suspicious lesions. Neurologic: Grossly intact, no focal deficits, moving all 4 extremities. Psychiatric: Normal mood and affect.   Assessment & Plan:    1. Vasectomy evaluation Today, we discussed what the vas deferens is, where it is located, and its function. We reviewed the procedure for vasectomy, it's risks, benefits, alternatives, and likelihood of achieving  his goals. We discussed in detail the procedure, complications, and recovery as well as the need for clearance prior to unprotected intercourse. We discussed that vasectomy does not protect against sexually transmitted diseases. We discussed that this procedure does not result in immediate sterility and that they would need to use other forms of birth control until he has been cleared with negative postvasectomy semen analyses. I explained that the procedure is considered to be permanent and that attempts at reversal have varying degrees of success. These options include vasectomy reversal, sperm retrieval, and in vitro fertilization; these can be very expensive. We discussed the chance of postvasectomy pain syndrome which  occurs in less than 5% of patients. I explained to the patient that there is no treatment to resolve this chronic pain, and that if it developed I would not be able to help resolve the issue, but that surgery is generally not needed for correction. I explained there have even been reports of systemic like illness associated with this chronic pain, and that there was no good cure. I explained that vasectomy it is not a 100% reliable form of birth control, and the risk of pregnancy after vasectomy is approximately 1 in 2000 men who had a negative postvasectomy semen analysis or rare non-motile sperm. I explained that repeat vasectomy was necessary in less than 1% of vasectomy procedures when employing the type of technique that I use. I explained that he should refrain from ejaculation for approximately one week following vasectomy. I explained that there are other options for birth control which are permanent and non-permanent; we discussed these. I explained the rates of surgical complications, such as symptomatic hematoma or infection, are low (1-2%) and vary with the surgeon's experience and criteria used to diagnose the complication.   The patient had the opportunity to ask questions to his stated satisfaction. He voiced understanding of the above factors and stated that he has read all the information provided to him and the packets and informed consent.  He declined Valium.   Hollice Espy, MD  [facility] [address], [place], [place] [address] [phone]

## 2022-08-21 NOTE — Patient Instructions (Signed)
Seizure, Adult A seizure is a sudden burst of abnormal electrical and chemical activity in the brain. Seizures usually last from 30 seconds to 2 minutes.  What are the causes? Common causes of this condition include: Fever or infection. Problems that affect the brain. These may include: A brain or head injury. Bleeding in the brain. A brain tumor. Low levels of blood sugar or salt. Kidney problems or liver problems. Conditions that are passed from parent to child (are inherited). Problems with a substance, such as: Having a reaction to a drug or a medicine. Stopping the use of a substance all of a sudden (withdrawal). A stroke. Disorders that affect how you develop. Sometimes, the cause may not be known.  What increases the risk? Having someone in your family who has epilepsy. In this condition, seizures happen again and again over time. They have no clear cause. Having had a tonic-clonic seizure before. This type of seizure causes you to: Tighten the muscles of the whole body. Lose consciousness. Having had a head injury or strokes before. Having had a lack of oxygen at birth. What are the signs or symptoms? There are many types of seizures. The symptoms vary depending on the type of seizure you have. Symptoms during a seizure Shaking that you cannot control (convulsions) with fast, jerky movements of muscles. Stiffness of the body. Breathing problems. Feeling mixed up (confused). Staring or not responding to sound or touch. Head nodding. Eyes that blink, flutter, or move fast. Drooling, grunting, or making clicking sounds with your mouth Losing control of when you pee or poop. Symptoms before a seizure Feeling afraid, nervous, or worried. Feeling like you may vomit. Feeling like: You are moving when you are not. Things around you are moving when they are not. Feeling like you saw or heard something before (dj vu). Odd tastes or smells. Changes in how you see. You may  see flashing lights or spots. Symptoms after a seizure Feeling confused. Feeling sleepy. Headache. Sore muscles. How is this treated? If your seizure stops on its own, you will not need treatment. If your seizure lasts longer than 5 minutes, you will normally need treatment. Treatment may include: Medicines given through an IV tube. Avoiding things, such as medicines, that are known to cause your seizures. Medicines to prevent seizures. A device to prevent or control seizures. Surgery. A diet low in carbohydrates and high in fat (ketogenic diet). Follow these instructions at home: Medicines Take over-the-counter and prescription medicines only as told by your doctor. Avoid foods or drinks that may keep your medicine from working, such as alcohol. Activity Follow instructions about driving, swimming, or doing things that would be dangerous if you had another seizure. Wait until your doctor says it is safe for you to do these things. If you live in the [place] ask your local department of motor vehicles when you can drive. Get a lot of rest. Teaching others  Teach friends and family what to do when you have a seizure. They should: Help you get down to the ground. Protect your head and body. Loosen any clothing around your neck. Turn you on your side. Know whether or not you need emergency care. Stay with you until you are better. Also, tell them what not to do if you have a seizure. Tell them: They should not hold you down. They should not put anything in your mouth. General instructions Avoid anything that gives you seizures. Keep a seizure diary. Write down: What you remember   about each seizure. What you think caused each seizure. Keep all follow-up visits. Contact a doctor if: You have another seizure or seizures. Call the doctor each time you have a seizure. The pattern of your seizures changes. You keep having seizures with treatment. You have symptoms of being sick or  having an infection. You are not able to take your medicine. Get help right away if: You have any of these problems: A seizure that lasts longer than 5 minutes. Many seizures in a row and you do not feel better between seizures. A seizure that makes it harder to breathe. A seizure and you can no longer speak or use part of your body. You do not wake up right after a seizure. You get hurt during a seizure. You feel confused or have pain right after a seizure. These symptoms may be an emergency. Get help right away. Call your local emergency services (911 in the [place] Do not wait to see if the symptoms will go away. Do not drive yourself to the hospital. Summary A seizure is a sudden burst of abnormal electrical and chemical activity in the brain. Seizures normally last from 30 seconds to 2 minutes. Causes of seizures include illness, injury to the head, low levels of blood sugar or salt, and certain conditions. Most seizures will stop on their own in less than 5 minutes. Seizures that last longer than 5 minutes are a medical emergency and need treatment right away. Many medicines are used to treat seizures. Take over-the-counter and prescription medicines only as told by your doctor. This information is not intended to replace advice given to you by your health care provider. Make sure you discuss any questions you have with your health care provider. Document Revised: 10/25/2019 Document Reviewed: 10/25/2019 Elsevier Patient Education  2023 [employer] Inc[employer]  

## 2022-08-26 ENCOUNTER — Encounter: Payer: Self-pay | Admitting: Nurse Practitioner

## 2022-08-26 ENCOUNTER — Ambulatory Visit: Payer: Managed Care, Other (non HMO) | Admitting: Nurse Practitioner

## 2022-08-26 VITALS — BP 123/78 | HR 74 | Temp 97.8°F | Ht 71.5 in | Wt 222.9 lb

## 2022-08-26 DIAGNOSIS — K219 Gastro-esophageal reflux disease without esophagitis: Secondary | ICD-10-CM | POA: Diagnosis not present

## 2022-08-26 DIAGNOSIS — G40909 Epilepsy, unspecified, not intractable, without status epilepticus: Secondary | ICD-10-CM

## 2022-08-26 DIAGNOSIS — M25511 Pain in right shoulder: Secondary | ICD-10-CM | POA: Diagnosis not present

## 2022-08-26 NOTE — Assessment & Plan Note (Signed)
Acute and improving after work injury.  No red flags and overall stable exam today.  Recommend OTC Voltaren gel + gentle stretching at home.  No heavy lifting until 100% improved.  May take Tylenol as needed.  Return for worsening or ongoing.

## 2022-08-26 NOTE — Assessment & Plan Note (Signed)
Chronic, ongoing with hiatal hernia in past noted on EGD.  Continue Protonix daily.  Refills sent in.  Risks of PPI use were discussed with patient including bone loss, C. Diff diarrhea, pneumonia, infections, CKD, electrolyte abnormalities.  Verbalizes understanding and chooses to continue the medication.  Mag level annually

## 2022-08-26 NOTE — Progress Notes (Signed)
BP 123/78   Pulse 74   Temp 97.8 F (36.6 C) (Oral)   Ht 5' 11.5" (1.816 m)   Wt 222 lb 14.4 oz (101.1 kg)   SpO2 98%   BMI 30.66 kg/m    Subjective:    Patient ID: Thomas Sawyer, male    DOB: 1985/11/17, 37 y.o.   MRN: [id number]  HPI: Thomas Sawyer is a 37 y.o. male  Chief Complaint  Patient presents with   Seizures   Gastroesophageal Reflux   GERD Continues on Protonix 40 MG daily.  Has hiatal hernia. GERD control status: stable Satisfied with current treatment? yes Heartburn frequency: occasional Medication side effects: no  Medication compliance: stable Dysphagia: no Odynophagia:  no Hematemesis: no Blood in stool: no EGD: yes  SEIZURE DISORDER: Followed by [facility] neurology, last saw 06/13/22 -- to continue on medication at this time.  Was diagnosed August 2023, had a LOC episode at time -- fell out and his wife heard a loud bang.  Currently taking Lamictal XR 300 MG nightly.  No further seizure activity since June 2023.  No further LOC episodes.  No family history of seizures.  Had MRI which was normal.  He is not a smoker and denies any alcohol use.  Does dip tobacco. Has history of brain tumor removal in 2011.   SHOULDER PAIN Recently injured right shoulder at work, was looking at part -- to stretch out.  Attempt to stretch out, pushed up with shoulder and felt discomfort.  Filled out workmen's comp papers at work.  Happened on 08/17/22.  Pain is improving. Duration: weeks Involved shoulder: right Mechanism of injury: trauma Location: posterior Onset:sudden Severity: 4/10 was a 7/10 last week Quality:  aching and throbbing Frequency: intermittent Radiation: no Aggravating factors: lifting and movement  Alleviating factors: rest  Status:  improving Treatments attempted: rest  Relief with NSAIDs?:  No NSAIDs Taken Weakness: no Numbness: no Decreased grip strength: no Redness: no Swelling: no Bruising: no Fevers: no   Relevant past medical, surgical,  family and social history reviewed and updated as indicated. Interim medical history since our last visit reviewed. Allergies and medications reviewed and updated.  Review of Systems  Constitutional:  Negative for activity change, diaphoresis, fatigue and fever.  Respiratory:  Negative for cough, chest tightness, shortness of breath and wheezing.   Cardiovascular:  Negative for chest pain, palpitations and leg swelling.  Musculoskeletal:  Positive for arthralgias.  Neurological:  Negative for dizziness, seizures, syncope, weakness, light-headedness, numbness and headaches.  Psychiatric/Behavioral: Negative.     Per HPI unless specifically indicated above     Objective:    BP 123/78   Pulse 74   Temp 97.8 F (36.6 C) (Oral)   Ht 5' 11.5" (1.816 m)   Wt 222 lb 14.4 oz (101.1 kg)   SpO2 98%   BMI 30.66 kg/m   Wt Readings from Last 3 Encounters:  08/26/22 222 lb 14.4 oz (101.1 kg)  07/01/22 224 lb 8 oz (101.8 kg)  06/01/22 229 lb 3.2 oz (104 kg)    Physical Exam Vitals and nursing note reviewed.  Constitutional:      General: He is awake. He is not in acute distress.    Appearance: He is well-developed and well-groomed. He is not ill-appearing or toxic-appearing.  HENT:     Head: Normocephalic.     Right Ear: Hearing and external ear normal.     Left Ear: Hearing and external ear normal.  Eyes:  General: Lids are normal.     Extraocular Movements: Extraocular movements intact.     Conjunctiva/sclera: Conjunctivae normal.  Neck:     Thyroid: No thyromegaly.     Vascular: No carotid bruit.  Cardiovascular:     Rate and Rhythm: Normal rate and regular rhythm.     Heart sounds: Normal heart sounds. No murmur heard.    No gallop.  Pulmonary:     Effort: Pulmonary effort is normal. No accessory muscle usage or respiratory distress.     Breath sounds: Normal breath sounds.  Abdominal:     General: Bowel sounds are normal. There is no distension.     Palpations: Abdomen  is soft.     Tenderness: There is no abdominal tenderness.  Musculoskeletal:     Right shoulder: Tenderness (mild to posterior shoulder) present. No swelling, laceration, bony tenderness or crepitus. Normal range of motion. Normal strength.     Left shoulder: Normal.     Cervical back: Full passive range of motion without pain.     Right lower leg: No edema.     Left lower leg: No edema.     Comments: No rashes noted.  Lymphadenopathy:     Cervical: No cervical adenopathy.  Skin:    General: Skin is warm.     Capillary Refill: Capillary refill takes less than 2 seconds.  Neurological:     Mental Status: He is alert and oriented to person, place, and time.     Deep Tendon Reflexes: Reflexes are normal and symmetric.     Reflex Scores:      Brachioradialis reflexes are 2+ on the right side and 2+ on the left side.      Patellar reflexes are 2+ on the right side and 2+ on the left side. Psychiatric:        Attention and Perception: Attention normal.        Mood and Affect: Mood normal.        Speech: Speech normal.        Behavior: Behavior normal. Behavior is cooperative.        Thought Content: Thought content normal.    Results for orders placed or performed in visit on 02/18/22  CBC with Differential/Platelet  Result Value Ref Range   WBC 5.8 3.4 - 10.8 x10E3/uL   RBC 4.39 4.14 - 5.80 x10E6/uL   Hemoglobin 14.0 13.0 - 17.7 g/dL   Hematocrit 16.1 09.6 - 51.0 %   MCV 93 79 - 97 fL   MCH 31.9 26.6 - 33.0 pg   MCHC 34.2 31.5 - 35.7 g/dL   RDW 04.5 40.9 - 81.1 %   Platelets 311 150 - 450 x10E3/uL   Neutrophils 57 Not Estab. %   Lymphs 33 Not Estab. %   Monocytes 7 Not Estab. %   Eos 2 Not Estab. %   Basos 1 Not Estab. %   Neutrophils Absolute 3.3 1.4 - 7.0 x10E3/uL   Lymphocytes Absolute 1.9 0.7 - 3.1 x10E3/uL   Monocytes Absolute 0.4 0.1 - 0.9 x10E3/uL   EOS (ABSOLUTE) 0.1 0.0 - 0.4 x10E3/uL   Basophils Absolute 0.0 0.0 - 0.2 x10E3/uL   Immature Granulocytes 0 Not  Estab. %   Immature Grans (Abs) 0.0 0.0 - 0.1 x10E3/uL  Comprehensive metabolic panel  Result Value Ref Range   Glucose 86 70 - 99 mg/dL   BUN 16 6 - 20 mg/dL   Creatinine, Ser 9.14 0.76 - 1.27 mg/dL   eGFR 93 >78  mL/min/1.73   BUN/Creatinine Ratio 15 9 - 20   Sodium 140 134 - 144 mmol/L   Potassium 4.0 3.5 - 5.2 mmol/L   Chloride 102 96 - 106 mmol/L   CO2 22 20 - 29 mmol/L   Calcium 9.6 8.7 - 10.2 mg/dL   Total Protein 7.1 6.0 - 8.5 g/dL   Albumin 5.1 4.1 - 5.1 g/dL   Globulin, Total 2.0 1.5 - 4.5 g/dL   Albumin/Globulin Ratio 2.6 (H) 1.2 - 2.2   Bilirubin Total 0.4 0.0 - 1.2 mg/dL   Alkaline Phosphatase 66 44 - 121 IU/L   AST 20 0 - 40 IU/L   ALT 28 0 - 44 IU/L  TSH  Result Value Ref Range   TSH 0.674 0.450 - 4.500 uIU/mL  Lipid Panel w/o Chol/HDL Ratio  Result Value Ref Range   Cholesterol, Total 232 (H) 100 - 199 mg/dL   Triglycerides 66 0 - 149 mg/dL   HDL 39 (L) >16 mg/dL   VLDL Cholesterol Cal 11 5 - 40 mg/dL   LDL Chol Calc (NIH) 109 (H) 0 - 99 mg/dL  Vitamin U04  Result Value Ref Range   Vitamin B-12 607 232 - 1,245 pg/mL  VITAMIN D 25 Hydroxy (Vit-D Deficiency, Fractures)  Result Value Ref Range   Vit D, 25-Hydroxy 28.7 (L) 30.0 - 100.0 ng/mL  Magnesium  Result Value Ref Range   Magnesium 2.2 1.6 - 2.3 mg/dL      Assessment & Plan:   Problem List Items Addressed This Visit       Digestive   Gastroesophageal reflux disease    Chronic, ongoing with hiatal hernia in past noted on EGD.  Continue Protonix daily.  Refills sent in.  Risks of PPI use were discussed with patient including bone loss, C. Diff diarrhea, pneumonia, infections, CKD, electrolyte abnormalities.  Verbalizes understanding and chooses to continue the medication.  Mag level annually        Nervous and Auditory   Seizure disorder (HCC) - Primary    Stable.  Diagnosed August 2023 with history of ventricular choroid plexus papilloma resected in 2012.  Currently followed by [facility] neurology,  will continue this collaboration and Lamictal as ordered.  Recent notes reviewed.        Other   Right shoulder pain    Acute and improving after work injury.  No red flags and overall stable exam today.  Recommend OTC Voltaren gel + gentle stretching at home.  No heavy lifting until 100% improved.  May take Tylenol as needed.  Return for worsening or ongoing.        Follow up plan: Return in about 6 months (around 02/25/2023) for Annual physical after 02/19/23.

## 2022-08-26 NOTE — Assessment & Plan Note (Signed)
Stable.  Diagnosed August 2023 with history of ventricular choroid plexus papilloma resected in 2012.  Currently followed by [facility] neurology, will continue this collaboration and Lamictal as ordered.  Recent notes reviewed.

## 2022-09-09 ENCOUNTER — Ambulatory Visit (INDEPENDENT_AMBULATORY_CARE_PROVIDER_SITE_OTHER): Payer: Managed Care, Other (non HMO) | Admitting: Urology

## 2022-09-09 VITALS — BP 122/80 | HR 79

## 2022-09-09 DIAGNOSIS — Z9852 Vasectomy status: Secondary | ICD-10-CM

## 2022-09-09 DIAGNOSIS — Z302 Encounter for sterilization: Secondary | ICD-10-CM

## 2022-09-09 NOTE — Patient Instructions (Signed)

## 2022-09-12 NOTE — Progress Notes (Signed)
09/12/22  CC:  Chief Complaint  Patient presents with   VAS    HPI: 37 year old male who presents today for vasectomy.  Blood pressure 122/80, pulse 79. NED. A&Ox3.   No respiratory distress   Abd soft, NT, ND Normal external genitalia with patent urethral meatus  A timeout was performed.  Patient's identity and consent was confirmed.  All questions were answered.   Bilateral Vasectomy Procedure  Pre-Procedure: - Patient's scrotum was prepped and draped for vasectomy. - The vas was palpated through the scrotal skin on the left. - 1% Xylocaine was injected into the skin and surrounding tissue for placement  - In a similar manner, the vas on the right was identified, anesthetized, and stabilized.  Procedure: - A #11 blade was used to make a small stab incision in the skin overlying the vas - The left vas was isolated and brought up through the incision exposing that structure. - Bleeding points were cauterized as they occurred. - The vas was free from the surrounding structures and brought to the view. - A segment was positioned for placement with a hemostat. - A second hemostat was placed and a small segment between the two hemostats and was removed for inspection. - Each end of the transected vas lumen was fulgurated/ obliterated using needlepoint electrocautery -A fascial interposition was performed on testicular end of the vas using #3-0 chromic suture -The same procedure was performed on the right. - A single suture of #3-0 chromic catgut was used to close each lateral scrotal skin incision - A dressing was applied.  Post-Procedure: - Patient was instructed in care of the operative area - A specimen is to be delivered in 12 weeks   -Another form of contraception is to be used until post vasectomy semen analysis  Vanna Scotland, MD

## 2022-12-20 ENCOUNTER — Other Ambulatory Visit: Payer: Managed Care, Other (non HMO)

## 2022-12-20 DIAGNOSIS — Z9852 Vasectomy status: Secondary | ICD-10-CM

## 2022-12-21 LAB — POST-VAS SPERM EVALUATION,QUAL: Volume: 2.1 mL

## 2023-01-16 ENCOUNTER — Other Ambulatory Visit: Payer: Self-pay | Admitting: Nurse Practitioner

## 2023-01-16 ENCOUNTER — Ambulatory Visit: Payer: Self-pay | Admitting: *Deleted

## 2023-01-16 MED ORDER — VALACYCLOVIR HCL 1 G PO TABS: 1000.0000 mg | ORAL_TABLET | Freq: Two times a day (BID) | ORAL | 3 refills | Status: DC | PRN

## 2023-01-16 MED ORDER — LAMOTRIGINE ER 100 MG PO TB24: 300.0000 mg | ORAL_TABLET | Freq: Every day | ORAL | 11 refills | Status: DC

## 2023-01-16 NOTE — Telephone Encounter (Signed)
Summary: requesting med refill   Pt called in requesting a refill for Rx valACYclovir (VALTREX) 1000 MG tablet stated he only takes this medication as needed however, he feels a sore coming up. Medication refill request sent.  Sending clinical due to symptoms.  Seeking clinical advice.

## 2023-01-16 NOTE — Telephone Encounter (Signed)
Medication Refill - Medication: valACYclovir (VALTREX) 1000 MG tablet,lamoTRIgine (LAMICTAL XR) 300 MG 24 hour tablet   Pt stated he needs his medication as soon as possible. Only takes it as needed for valACYclovir (VALTREX) 1000 MG tablet. Pt mentioned when he was last seen at [facility] it was changed from 100 mg to 300 mg so he won't have to take 3 tablets a day.  Has the patient contacted their pharmacy? No.  (Agent: If yes, when and what did the pharmacy advise?)  Preferred Pharmacy (with phone number or street name):  [facility] [address] [place] - [address]  [address] [place] [address]  Phone: [phone] Fax: [phone]  Hours: Not open 24 hours   Has the patient been seen for an appointment in the last year OR does the patient have an upcoming appointment? Yes.    Agent: Please be advised that RX refills may take up to 3 business days. We ask that you follow-up with your pharmacy.

## 2023-01-16 NOTE — Telephone Encounter (Signed)
Requested Prescriptions  Pending Prescriptions Disp Refills   valACYclovir (VALTREX) 1000 MG tablet 20 tablet 3    Sig: Take 1 tablet (1,000 mg total) by mouth 2 (two) times daily as needed.     Antimicrobials:  Antiviral Agents - Anti-Herpetic Passed - 01/16/2023 11:19 AM      Passed - Valid encounter within last 12 months    Recent Outpatient Visits           4 months ago Seizure disorder (HCC)   [place] [facility] [place], Jolene T, NP   7 months ago Class 1 obesity due to excess calories without serious comorbidity with body mass index (BMI) of 30.0 to 30.9 in adult   [place] [facility] [place], Dorie Rank, NP   11 months ago Seizure disorder [facility])   [place] [facility] [place], Corrie Dandy T, NP   1 year ago Loss of consciousness (HCC)   [place] [facility] Mecum, Oswaldo Conroy, PA-C   1 year ago Right testicular pain   [place] [facility] [place], [place] T, NP       Future Appointments             In 1 month [place], [place] T, NP [place] [facility]             lamoTRIgine (LAMICTAL XR) 100 MG 24 hour tablet 90 tablet 11    Sig: Take 3 tablets (300 mg total) by mouth daily.     Neurology:  Anticonvulsants - lamotrigine Passed - 01/16/2023 11:19 AM      Passed - Cr in normal range and within 360 days    Creatinine, Ser  Date Value Ref Range Status  02/18/2022 1.05 0.76 - 1.27 mg/dL Final         Passed - ALT in normal range and within 360 days    ALT  Date Value Ref Range Status  02/18/2022 28 0 - 44 IU/L Final         Passed - AST in normal range and within 360 days    AST  Date Value Ref Range Status  02/18/2022 20 0 - 40 IU/L Final         Passed - Completed PHQ-2 or PHQ-9 in the last 360 days      Passed - Valid encounter within last 12 months    Recent Outpatient Visits           4 months ago Seizure disorder (HCC)   [place] [facility] [place], Jolene T, NP   7 months ago Class 1 obesity due to excess calories without serious comorbidity with body mass index (BMI) of 30.0 to 30.9 in adult   [place] [facility] [place], Dorie Rank, NP   11 months ago Seizure disorder [facility])   [place] [facility] [place], Corrie Dandy T, NP   1 year ago Loss of consciousness (HCC)   [place] [facility] Mecum, Oswaldo Conroy, PA-C   1 year ago Right testicular pain   [place] [facility] [place], Dorie Rank, NP       Future Appointments             In 1 month Cannady, Dorie Rank, NP [place] [facility]

## 2023-01-16 NOTE — Telephone Encounter (Signed)
Requested medication (s) are due for refill today - unsure  Requested medication (s) are on the active medication list -yes  Future visit scheduled -yes  Last refill: listed as historical  Notes to clinic: Patient states he would like dosing changed to 300 mg- so he only takes one pill- request sent for provider review   Requested Prescriptions  Pending Prescriptions Disp Refills   lamoTRIgine (LAMICTAL XR) 100 MG 24 hour tablet 90 tablet 11    Sig: Take 3 tablets (300 mg total) by mouth daily.     Neurology:  Anticonvulsants - lamotrigine Passed - 01/16/2023 11:19 AM      Passed - Cr in normal range and within 360 days    Creatinine, Ser  Date Value Ref Range Status  02/18/2022 1.05 0.76 - 1.27 mg/dL Final         Passed - ALT in normal range and within 360 days    ALT  Date Value Ref Range Status  02/18/2022 28 0 - 44 IU/L Final         Passed - AST in normal range and within 360 days    AST  Date Value Ref Range Status  02/18/2022 20 0 - 40 IU/L Final         Passed - Completed PHQ-2 or PHQ-9 in the last 360 days      Passed - Valid encounter within last 12 months    Recent Outpatient Visits           4 months ago Seizure disorder (HCC)   [place] [facility] [place], Jolene T, NP   7 months ago Class 1 obesity due to excess calories without serious comorbidity with body mass index (BMI) of 30.0 to 30.9 in adult   [place] [facility] [place], Corrie Dandy T, NP   11 months ago Seizure disorder [facility])   [place] [facility] [place], Corrie Dandy T, NP   1 year ago Loss of consciousness (HCC)   [place] [facility] Mecum, Oswaldo Conroy, PA-C   1 year ago Right testicular pain   [place] [facility] [place], Donovan T, NP       Future Appointments             In 1 month [place], [place] T, NP [place] [facility]            Signed Prescriptions Disp Refills   valACYclovir  (VALTREX) 1000 MG tablet 20 tablet 3    Sig: Take 1 tablet (1,000 mg total) by mouth 2 (two) times daily as needed.     Antimicrobials:  Antiviral Agents - Anti-Herpetic Passed - 01/16/2023 11:19 AM      Passed - Valid encounter within last 12 months    Recent Outpatient Visits           4 months ago Seizure disorder (HCC)   [place] [facility] [place], Jolene T, NP   7 months ago Class 1 obesity due to excess calories without serious comorbidity with body mass index (BMI) of 30.0 to 30.9 in adult   [place] [facility] [place], Dorie Rank, NP   11 months ago Seizure disorder [facility])   [place] [facility] [place], Corrie Dandy T, NP   1 year ago Loss of consciousness (HCC)   [place] [facility] Mecum, Oswaldo Conroy, PA-C   1 year ago Right testicular pain   [place] [facility] Marjie Skiff, NP       Future Appointments             In 1 month [place], Dorie Rank, NP [place] [facility]               Requested Prescriptions  Pending Prescriptions Disp Refills   lamoTRIgine (LAMICTAL XR) 100 MG 24 hour tablet 90 tablet 11    Sig: Take 3 tablets (300 mg total) by mouth daily.     Neurology:  Anticonvulsants - lamotrigine Passed - 01/16/2023 11:19 AM      Passed - Cr in normal range and within 360 days    Creatinine, Ser  Date Value Ref Range Status  02/18/2022 1.05 0.76 - 1.27 mg/dL Final         Passed - ALT in normal range and within 360 days    ALT  Date Value Ref Range Status  02/18/2022 28 0 - 44 IU/L Final         Passed - AST in normal range and within 360 days    AST  Date Value Ref Range Status  02/18/2022 20 0 - 40 IU/L Final         Passed - Completed PHQ-2 or PHQ-9 in the last 360 days      Passed - Valid encounter within last 12 months    Recent Outpatient Visits           4 months ago Seizure disorder (HCC)   [place] [facility]  [place], Jolene T, NP   7 months ago Class 1 obesity due to excess calories without serious comorbidity with body mass index (BMI) of 30.0 to 30.9 in adult   [place] [facility] [place], Corrie Dandy T, NP   11 months ago Seizure disorder [facility])   [place] [facility] [place], Corrie Dandy T, NP   1 year ago Loss of consciousness (HCC)   [place] [facility] Mecum, Oswaldo Conroy, PA-C   1 year ago Right testicular pain   [place] [facility] [place], [place] T, NP       Future Appointments             In 1 month [place], [place] T, NP [place] [facility]            Signed Prescriptions Disp Refills   valACYclovir (VALTREX) 1000 MG tablet 20 tablet 3    Sig: Take 1 tablet (1,000 mg total) by mouth 2 (two) times daily as needed.     Antimicrobials:  Antiviral Agents - Anti-Herpetic Passed - 01/16/2023 11:19 AM      Passed - Valid encounter within last 12 months    Recent Outpatient Visits           4 months ago Seizure disorder (HCC)   [place] [facility] [place], Jolene T, NP   7 months ago Class 1 obesity due to excess calories without serious comorbidity with body mass index (BMI) of 30.0 to 30.9 in adult   [place] [facility] [place], Dorie Rank, NP   11 months ago Seizure disorder [facility])   [place] [facility] [place], Corrie Dandy T, NP   1 year ago Loss of consciousness [facility])    [facility] Mecum, Oswaldo Conroy, PA-C   1 year ago Right testicular pain    [facility] [place], Dorie Rank, NP  Future Appointments             In 1 month Cannady, Dorie Rank, NP [place] [facility]

## 2023-01-16 NOTE — Telephone Encounter (Signed)
  Chief Complaint: valACYclovir (VALTREX) 1000 MG tablet  Symptoms: sore starting- chronic condition  Disposition: [] ED /[] Urgent Care (no appt availability in office) / [] Appointment(In office/virtual)/ []  [facility]/ [x] Home Care/ [] Refused Recommended Disposition /[] [facility]/ []  Follow-up with PCP Additional Notes: Rx has been requested and filled(passes RF protocol)- patient has been notified    Reason for Disposition . [1] Caller requesting a prescription renewal (no refills left), no triage required, AND [2] triager able to renew prescription per department policy  Answer Assessment - Initial Assessment Questions 1. DRUG NAME: "What medicine do you need to have refilled?"     valACYclovir (VALTREX) 1000 MG tablet  2. REFILLS REMAINING: "How many refills are remaining?" (Note: The label on the medicine or pill bottle will show how many refills are remaining. If there are no refills remaining, then a renewal may be needed.)     none 3. EXPIRATION DATE: "What is the expiration date?" (Note: The label states when the prescription will expire, and thus can no longer be refilled.)     None- chronic medication  4. PRESCRIBING HCP: "Who prescribed it?" Reason: If prescribed by specialist, call should be referred to that group.     PCP 5. SYMPTOMS: "Do you have any symptoms?"     Patient states he is starting to have symptom  Protocols used: Medication Refill and Renewal Call-A-AH

## 2023-02-26 NOTE — Patient Instructions (Signed)

## 2023-03-03 ENCOUNTER — Encounter: Payer: Self-pay | Admitting: Nurse Practitioner

## 2023-03-03 ENCOUNTER — Ambulatory Visit: Payer: Managed Care, Other (non HMO) | Admitting: Nurse Practitioner

## 2023-03-03 VITALS — BP 120/78 | HR 96 | Temp 98.4°F | Ht 72.0 in | Wt 218.6 lb

## 2023-03-03 DIAGNOSIS — F5101 Primary insomnia: Secondary | ICD-10-CM

## 2023-03-03 DIAGNOSIS — E7800 Pure hypercholesterolemia, unspecified: Secondary | ICD-10-CM | POA: Diagnosis not present

## 2023-03-03 DIAGNOSIS — Z0000 Encounter for general adult medical examination without abnormal findings: Secondary | ICD-10-CM

## 2023-03-03 DIAGNOSIS — F1911 Other psychoactive substance abuse, in remission: Secondary | ICD-10-CM

## 2023-03-03 DIAGNOSIS — N50812 Left testicular pain: Secondary | ICD-10-CM | POA: Insufficient documentation

## 2023-03-03 DIAGNOSIS — G40909 Epilepsy, unspecified, not intractable, without status epilepticus: Secondary | ICD-10-CM

## 2023-03-03 DIAGNOSIS — Z720 Tobacco use: Secondary | ICD-10-CM

## 2023-03-03 DIAGNOSIS — F4321 Adjustment disorder with depressed mood: Secondary | ICD-10-CM | POA: Diagnosis not present

## 2023-03-03 DIAGNOSIS — K219 Gastro-esophageal reflux disease without esophagitis: Secondary | ICD-10-CM

## 2023-03-03 DIAGNOSIS — E559 Vitamin D deficiency, unspecified: Secondary | ICD-10-CM

## 2023-03-03 MED ORDER — TRAZODONE HCL 50 MG PO TABS: 25.0000 mg | ORAL_TABLET | Freq: Every evening | ORAL | 3 refills | Status: DC | PRN

## 2023-03-03 NOTE — Assessment & Plan Note (Signed)
Chronic, ongoing issue.  No benefit from Melatonin.  Will trial Trazodone 25-50 MG at night as needed.  Educated him on this medication and side effects.  Is aware to alert provider if any issues present. May also help mood. ?Maintain a regular sleep schedule, particularly a regular wake-up time in the morning ?Try not to force sleep ?Avoid caffeinated beverages after lunch ?Avoid alcohol near bedtime (eg, late afternoon and evening)  ?Avoid smoking or other nicotine intake, particularly during the evening ?Adjust the bedroom environment as needed to decrease stimuli (eg, reduce ambient light, turn off the television or radio) ?Avoid prolonged use of light-emitting screens (laptops, tablets, smartphones, ebooks) before bedtime  ?Resolve concerns or worries before bedtime ?Exercise regularly for at least 20 minutes, preferably more than four to five hours prior to bedtime  ?Avoid daytime naps, especially if they are longer than 20 to 30 minutes or occur late in the day

## 2023-03-03 NOTE — Assessment & Plan Note (Signed)
Recommend complete cessation of dipping.

## 2023-03-03 NOTE — Assessment & Plan Note (Addendum)
Stable.  Diagnosed August 2023 with history of ventricular choroid plexus papilloma resected in 2012.  Currently followed by [facility] neurology, will continue this collaboration and Lamictal as ordered.  Recent notes reviewed. To return to them in future and remain on Lamictal at this time with plan for reduction in a few years.  Check level.

## 2023-03-03 NOTE — Assessment & Plan Note (Signed)
Sober for >6 years, praised for this and recommend continued cessation.  Support on this journey.

## 2023-03-03 NOTE — Assessment & Plan Note (Signed)
Noted on past labs, recheck CMP and lipid panel today.  Continue focus on diet and regular activity.

## 2023-03-03 NOTE — Assessment & Plan Note (Signed)
Ongoing for one month, had vasectomy in May.  ?scar tissue vs hernia.  Will obtain imaging to further assess area and then determine next steps after results return.  Recommend he wear supportive underwear and try to avoid frequent heavy lifting until after imaging returns.

## 2023-03-03 NOTE — Assessment & Plan Note (Signed)
Situational depression and anxiety at this time due to increased home stressors and financial concerns.  At this time wishes to maintain Lamictal as mood stabilizer and for seizures.  Does not want to add anything else at this time.  Denies SI/HI.

## 2023-03-03 NOTE — Assessment & Plan Note (Signed)
Noted on labs in 2023, not taking supplement for this.  Recheck today and initiate supplement as needed.

## 2023-03-03 NOTE — Assessment & Plan Note (Signed)
Chronic, ongoing with hiatal hernia in past noted on EGD.  Continue Protonix daily only as needed.  Risks of daily PPI use were discussed with patient including bone loss, C. Diff diarrhea, pneumonia, infections, CKD, electrolyte abnormalities.  Verbalizes understanding and chooses to continue the medication.  Mag level annually

## 2023-03-03 NOTE — Progress Notes (Signed)
BP 120/78 (BP Location: Left Arm, Patient Position: Sitting, Cuff Size: Normal)   Pulse 96   Temp 98.4 F (36.9 C) (Oral)   Ht 6' (1.829 m)   Wt 218 lb 9.6 oz (99.2 kg)   SpO2 97%   BMI 29.65 kg/m    Subjective:    Patient ID: Thomas Sawyer, male    DOB: 24-Dec-1985, 37 y.o.   MRN: [id number]  HPI: Thomas Sawyer is a 37 y.o. male presenting on 03/03/2023 for comprehensive medical examination. Current medical complaints include: testicular pain  He currently lives with: wife an kids Interim Problems from his last visit: testicular pain and insomnia  Currently takes no vitamins at home.  History of low Vitamin D on labs.  TESTICULAR PAIN: He had a vasectomy on 09/09/22, he has noticed pain to left side of testicle for about one month off and on.  Currently more sharp in pain and did feel up in abdomen one time.  Has had issues with this side before.  Current pain is 6/10 at times.  Has been trying to sleep on back or left side to help.  No other treatments.  Currently physical work makes pain worse.  SEIZURE DISORDER: Followed by [facility] neurology in past, last 06/13/22 -- to return as needed only.  If keeps going well in a few years neurology plans to wean off medication.  To continue on medication at this time.  He was diagnosed in August 2023 after an LOC episode.  Continues Lamictal XR 300 MG nightly.  Denies any further seizure episodes or LOC since June 2023.  Has no family history of seizures.  Past MRI was reassuring.  He continues to dip tobaccos.  Does have history of a brain tumor removal in 2011.  GERD Has Protonix 40 MG, currently takes only as needed. GERD control status: stable Satisfied with current treatment? yes Heartburn frequency: occasional Medication side effects: no  Medication compliance: stable Dysphagia: no Odynophagia:  no Hematemesis: no Blood in stool: no EGD: no   STRESSORS AND SLEEP: Has been super stressed recently.  His son and him have finally  talked this week, before this had been 7 months since they talked.  Does struggle with sleeping due to this.  Hard to go to sleep.  Has tried Melatonin 5 MG, was helping for a period but makes him tired in morning.   Depression Screen    03/03/2023    3:06 PM 02/18/2022    2:02 PM 02/12/2021    2:02 PM 02/07/2020    1:16 PM 12/28/2018    2:52 PM  Depression screen PHQ 2/9  Decreased Interest 1 0 0 0 0  Down, Depressed, Hopeless 0 2 0 0 0  PHQ - 2 Score 1 2 0 0 0  Altered sleeping 2 3 0  1  Tired, decreased energy 1 2 0  1  Change in appetite 0 1 0  0  Feeling bad or failure about yourself  0 2 0  0  Trouble concentrating 1 2 0  1  Moving slowly or fidgety/restless 2 3 0  0  Suicidal thoughts 0 0 0  0  PHQ-9 Score 7 15 0  3  Difficult doing work/chores  Not difficult at all Not difficult at all        03/03/2023    3:05 PM 02/18/2022    2:02 PM 02/12/2021    2:03 PM 12/28/2018    2:53 PM  GAD 7 :  Generalized Anxiety Score  Nervous, Anxious, on Edge 2 2 2  0  Control/stop worrying 1 2 0 0  Worry too much - different things 2 3 0 1  Trouble relaxing 1 2 1 2   Restless 2 2 0 0  Easily annoyed or irritable 3 3 2 1   Afraid - awful might happen 0 0 0 1  Total GAD 7 Score 11 14 5 5   Anxiety Difficulty  Not difficult at all Not difficult at all Not difficult at all      12/28/2018    2:52 PM 01/24/2020    4:26 PM 02/07/2020    1:15 PM 02/12/2021    2:15 PM 02/18/2022    1:59 PM  Fall Risk  Falls in the past year? 1  0 0 0  Was there an injury with Fall? 1  0 0 0  Fall Risk Category Calculator 2  0 0 0  Fall Risk Category (Retired) Moderate  Low Low Low  (RETIRED) Patient Fall Risk Level Low fall risk Low fall risk Low fall risk Low fall risk Low fall risk  Patient at Risk for Falls Due to   No Fall Risks No Fall Risks No Fall Risks  Fall risk Follow up   Falls evaluation completed Falls prevention discussed Falls evaluation completed    Functional Status Survey: Is the patient  deaf or have difficulty hearing?: No Does the patient have difficulty seeing, even when wearing glasses/contacts?: No Does the patient have difficulty concentrating, remembering, or making decisions?: No Does the patient have difficulty walking or climbing stairs?: No Does the patient have difficulty dressing or bathing?: No Does the patient have difficulty doing errands alone such as visiting a doctor's office or shopping?: No   Past Medical History:  Past Medical History:  Diagnosis Date   ADHD (attention deficit hyperactivity disorder)    Anxiety    Depression    Depression    Genital warts    Hiatal hernia    Oppositional defiant behavior     Surgical History:  Past Surgical History:  Procedure Laterality Date   BRAIN SURGERY  05/02/2009   tumor removal    Medications:  Current Outpatient Medications on File Prior to Visit  Medication Sig   lamoTRIgine (LAMICTAL XR) 100 MG 24 hour tablet Take 3 tablets (300 mg total) by mouth daily.   pantoprazole (PROTONIX) 40 MG tablet Take 1 tablet (40 mg total) by mouth daily.   valACYclovir (VALTREX) 1000 MG tablet Take 1 tablet (1,000 mg total) by mouth 2 (two) times daily as needed.   No current facility-administered medications on file prior to visit.    Allergies:  Not on File  Social History:  Social History   Socioeconomic History   Marital status: Married    Spouse name: Not on file   Number of children: Not on file   Years of education: Not on file   Highest education level: Not on file  Occupational History   Not on file  Tobacco Use   Smoking status: Former    Current packs/day: 0.50    Average packs/day: 0.5 packs/day for 18.0 years (9.0 ttl pk-yrs)    Types: Cigarettes   Smokeless tobacco: Current    Types: Snuff  Vaping Use   Vaping status: Former  Substance and Sexual Activity   Alcohol use: Not Currently    Comment: rare   Drug use: Not Currently    Types: Marijuana   Sexual activity: Not on file  Other Topics Concern   Not on file  Social History Narrative   Not on file   Social Determinants of Health   Financial Resource Strain: Low Risk  (02/12/2021)   Overall Financial Resource Strain (CARDIA)    Difficulty of Paying Living Expenses: Not hard at all  Food Insecurity: No Food Insecurity (02/12/2021)   Hunger Vital Sign    Worried About Running Out of Food in the Last Year: Never true    Ran Out of Food in the Last Year: Never true  Transportation Needs: No Transportation Needs (02/12/2021)   PRAPARE - Administrator, Civil Service (Medical): No    Lack of Transportation (Non-Medical): No  Physical Activity: Sufficiently Active (02/12/2021)   Exercise Vital Sign    Days of Exercise per Week: 5 days    Minutes of Exercise per Session: 40 min  Stress: No Stress Concern Present (02/12/2021)   [employer] of Occupational Health - Occupational Stress Questionnaire    Feeling of Stress : Not at all  Social Connections: Moderately Isolated (02/12/2021)   Social Connection and Isolation Panel [NHANES]    Frequency of Communication with Friends and Family: More than three times a week    Frequency of Social Gatherings with Friends and Family: More than three times a week    Attends Religious Services: Never    Database administrator or Organizations: No    Attends Banker Meetings: Never    Marital Status: Married  Catering manager Violence: Not At Risk (02/12/2021)   Humiliation, Afraid, Rape, and Kick questionnaire    Fear of Current or Ex-Partner: No    Emotionally Abused: No    Physically Abused: No    Sexually Abused: No   Social History   Tobacco Use  Smoking Status Former   Current packs/day: 0.50   Average packs/day: 0.5 packs/day for 18.0 years (9.0 ttl pk-yrs)   Types: Cigarettes  Smokeless Tobacco Current   Types: Snuff   Social History   Substance and Sexual Activity  Alcohol Use Not Currently   Comment: rare     Family History:  Family History  Problem Relation Age of Onset   Asthma Father    Diabetes Father    Cancer - Lung Father    Cancer Neg Hx    COPD Neg Hx    Heart disease Neg Hx    Hypertension Neg Hx    Stroke Neg Hx    Prostate cancer Neg Hx    Sickle cell trait Neg Hx    Bladder Cancer Neg Hx    Kidney cancer Neg Hx     Past medical history, surgical history, medications, allergies, family history and social history reviewed with patient today and changes made to appropriate areas of the chart.   ROS All other ROS negative except what is listed above and in the HPI.      Objective:    BP 120/78 (BP Location: Left Arm, Patient Position: Sitting, Cuff Size: Normal)   Pulse 96   Temp 98.4 F (36.9 C) (Oral)   Ht 6' (1.829 m)   Wt 218 lb 9.6 oz (99.2 kg)   SpO2 97%   BMI 29.65 kg/m   Wt Readings from Last 3 Encounters:  03/03/23 218 lb 9.6 oz (99.2 kg)  08/26/22 222 lb 14.4 oz (101.1 kg)  07/01/22 224 lb 8 oz (101.8 kg)   Physical Exam Vitals and nursing note reviewed. Exam conducted with a chaperone present.  Constitutional:      General: He is awake. He is not in acute distress.    Appearance: Normal appearance. He is well-developed and well-groomed. He is not ill-appearing or toxic-appearing.  HENT:     Head: Normocephalic and atraumatic.     Right Ear: Hearing, tympanic membrane, ear canal and external ear normal. No drainage.     Left Ear: Hearing, tympanic membrane, ear canal and external ear normal. No drainage.     Nose: Nose normal.     Mouth/Throat:     Pharynx: Uvula midline.  Eyes:     General: Lids are normal.        Right eye: No discharge.        Left eye: No discharge.     Extraocular Movements: Extraocular movements intact.     Conjunctiva/sclera: Conjunctivae normal.     Pupils: Pupils are equal, round, and reactive to light.     Visual Fields: Right eye visual fields normal and left eye visual fields normal.  Neck:     Thyroid: No  thyromegaly.     Vascular: No carotid bruit or JVD.     Trachea: Trachea normal.  Cardiovascular:     Rate and Rhythm: Normal rate and regular rhythm.     Heart sounds: Normal heart sounds, S1 normal and S2 normal. No murmur heard.    No gallop.  Pulmonary:     Effort: Pulmonary effort is normal. No accessory muscle usage or respiratory distress.     Breath sounds: Normal breath sounds.  Abdominal:     General: Bowel sounds are normal.     Palpations: Abdomen is soft. There is no hepatomegaly or splenomegaly.     Tenderness: There is no abdominal tenderness.  Genitourinary:    Penis: Normal.      Testes: Normal.     Epididymis:     Right: Normal.     Left: Normal.    Musculoskeletal:        General: Normal range of motion.     Cervical back: Normal range of motion and neck supple.     Right lower leg: No edema.     Left lower leg: No edema.  Lymphadenopathy:     Head:     Right side of head: No submental, submandibular, tonsillar, preauricular or posterior auricular adenopathy.     Left side of head: No submental, submandibular, tonsillar, preauricular or posterior auricular adenopathy.     Cervical: No cervical adenopathy.  Skin:    General: Skin is warm and dry.     Capillary Refill: Capillary refill takes less than 2 seconds.     Findings: No rash.  Neurological:     Mental Status: He is alert and oriented to person, place, and time.     Gait: Gait is intact.     Deep Tendon Reflexes: Reflexes are normal and symmetric.     Reflex Scores:      Brachioradialis reflexes are 2+ on the right side and 2+ on the left side.      Patellar reflexes are 2+ on the right side and 2+ on the left side. Psychiatric:        Attention and Perception: Attention normal.        Mood and Affect: Mood normal.        Speech: Speech normal.        Behavior: Behavior normal. Behavior is cooperative.        Thought Content: Thought content normal.  Cognition and Memory: Cognition  normal.    Results for orders placed or performed in visit on 12/20/22  Post-Vas Sperm Evaluation,Qual  Result Value Ref Range   Volume 2.1 Not Estab. mL   Post-Vas Sperm, Qual Comment Absent      Assessment & Plan:   Problem List Items Addressed This Visit       Digestive   Gastroesophageal reflux disease    Chronic, ongoing with hiatal hernia in past noted on EGD.  Continue Protonix daily only as needed.  Risks of daily PPI use were discussed with patient including bone loss, C. Diff diarrhea, pneumonia, infections, CKD, electrolyte abnormalities.  Verbalizes understanding and chooses to continue the medication.  Mag level annually      Relevant Orders   Magnesium     Nervous and Auditory   Seizure disorder (HCC) - Primary    Stable.  Diagnosed August 2023 with history of ventricular choroid plexus papilloma resected in 2012.  Currently followed by [facility] neurology, will continue this collaboration and Lamictal as ordered.  Recent notes reviewed. To return to them in future and remain on Lamictal at this time with plan for reduction in a few years.  Check level.      Relevant Orders   Lamotrigine level     Other   Chewing tobacco use    Recommend complete cessation of dipping.      Elevated low density lipoprotein (LDL) cholesterol level    Noted on past labs, recheck CMP and lipid panel today.  Continue focus on diet and regular activity.      Relevant Orders   Comprehensive metabolic panel   Lipid Panel w/o Chol/HDL Ratio   Hx of substance abuse (HCC)    Sober for >6 years, praised for this and recommend continued cessation.  Support on this journey.      Pain in left testicle    Ongoing for one month, had vasectomy in May.  ?scar tissue vs hernia.  Will obtain imaging to further assess area and then determine next steps after results return.  Recommend he wear supportive underwear and try to avoid frequent heavy lifting until after imaging returns.         Relevant Orders   US Scrotum   Primary insomnia    Chronic, ongoing issue.  No benefit from Melatonin.  Will trial Trazodone 25-50 MG at night as needed.  Educated him on this medication and side effects.  Is aware to alert provider if any issues present. May also help mood. ?Maintain a regular sleep schedule, particularly a regular wake-up time in the morning ?Try not to force sleep ?Avoid caffeinated beverages after lunch ?Avoid alcohol near bedtime (eg, late afternoon and evening)  ?Avoid smoking or other nicotine intake, particularly during the evening ?Adjust the bedroom environment as needed to decrease stimuli (eg, reduce ambient light, turn off the television or radio) ?Avoid prolonged use of light-emitting screens (laptops, tablets, smartphones, ebooks) before bedtime  ?Resolve concerns or worries before bedtime ?Exercise regularly for at least 20 minutes, preferably more than four to five hours prior to bedtime  ?Avoid daytime naps, especially if they are longer than 20 to 30 minutes or occur late in the day       Situational depression    Situational depression and anxiety at this time due to increased home stressors and financial concerns.  At this time wishes to maintain Lamictal as mood stabilizer and for seizures.  Does not want to add anything  else at this time.  Denies SI/HI.      Relevant Medications   traZODone (DESYREL) 50 MG tablet   Vitamin D deficiency    Noted on labs in 2023, not taking supplement for this.  Recheck today and initiate supplement as needed.      Relevant Orders   VITAMIN D 25 Hydroxy (Vit-D Deficiency, Fractures)   Other Visit Diagnoses     Encounter for annual physical exam       Annual physical today with labs and health maintenance reviewed, discussed with patient.   Relevant Orders   CBC with Differential/Platelet   TSH       LABORATORY TESTING:  Health maintenance labs ordered today as discussed above.   IMMUNIZATIONS:   - Tdap:  Tetanus vaccination status reviewed: last tetanus booster within 10 years. - Influenza: Refused - Pneumovax: Not applicable - Prevnar: Not applicable - Zostavax vaccine: Not applicable  SCREENING: - Colonoscopy: Not applicable  Discussed with patient purpose of the colonoscopy is to detect colon cancer at curable precancerous or early stages   - AAA Screening: Not applicable  -Hearing Test: Not applicable  -Spirometry: Not applicable   PATIENT COUNSELING:    Sexuality: Discussed sexually transmitted diseases, partner selection, use of condoms, avoidance of unintended pregnancy  and contraceptive alternatives.   Advised to avoid cigarette smoking.  I discussed with the patient that most people either abstain from alcohol or drink within safe limits (<=14/week and <=4 drinks/occasion for males, <=7/weeks and <= 3 drinks/occasion for females) and that the risk for alcohol disorders and other health effects rises proportionally with the number of drinks per week and how often a drinker exceeds daily limits.  Discussed cessation/primary prevention of drug use and availability of treatment for abuse.   Diet: Encouraged to adjust caloric intake to maintain  or achieve ideal body weight, to reduce intake of dietary saturated fat and total fat, to limit sodium intake by avoiding high sodium foods and not adding table salt, and to maintain adequate dietary potassium and calcium preferably from fresh fruits, vegetables, and low-fat dairy products.    Stressed the importance of regular exercise  Injury prevention: Discussed safety belts, safety helmets, smoke detector, smoking near bedding or upholstery.   Dental health: Discussed importance of regular tooth brushing, flossing, and dental visits.   Follow up plan: NEXT PREVENTATIVE PHYSICAL DUE IN 1 YEAR. Return in about 6 weeks (around 04/14/2023) for INSOMNIA, TESTICULAR PAIN.

## 2023-03-04 NOTE — Progress Notes (Signed)
Contacted via MyChart    Good morning Joshawn, your labs have returned: - Kidney function, creatinine and eGFR, remains normal, as is liver function, AST and ALT.  - Vitamin D still a little low, ensure you are taking Vitamin D3 2000 units daily. - CBC shows no anemia or infection.  Thyroid level, TSH, normal. - Lipid panel shows ongoing elevation in LDL and total cholesterol. No medications needed at this time, but focus heavily on diet and regular exercise.   - Lamictal level has not returned, but if abnormal I will let you know.  Any questions? Keep being amazing!!  Thank you for allowing me to participate in your care.  I appreciate you. Kindest regards, Trigo Winterbottom

## 2023-03-07 LAB — CBC WITH DIFFERENTIAL/PLATELET
Basophils Absolute: 0.1 10*3/uL (ref 0.0–0.2)
Basos: 1 %
EOS (ABSOLUTE): 0.1 10*3/uL (ref 0.0–0.4)
Eos: 1 %
Hematocrit: 41.8 % (ref 37.5–51.0)
Hemoglobin: 13.9 g/dL (ref 13.0–17.7)
Immature Grans (Abs): 0 10*3/uL (ref 0.0–0.1)
Immature Granulocytes: 0 %
Lymphocytes Absolute: 2 10*3/uL (ref 0.7–3.1)
Lymphs: 26 %
MCH: 31.7 pg (ref 26.6–33.0)
MCHC: 33.3 g/dL (ref 31.5–35.7)
MCV: 95 fL (ref 79–97)
Monocytes Absolute: 0.5 10*3/uL (ref 0.1–0.9)
Monocytes: 6 %
Neutrophils Absolute: 4.9 10*3/uL (ref 1.4–7.0)
Neutrophils: 66 %
Platelets: 335 10*3/uL (ref 150–450)
RBC: 4.39 x10E6/uL (ref 4.14–5.80)
RDW: 12.2 % (ref 11.6–15.4)
WBC: 7.5 10*3/uL (ref 3.4–10.8)

## 2023-03-07 LAB — COMPREHENSIVE METABOLIC PANEL
ALT: 29 [IU]/L (ref 0–44)
AST: 20 [IU]/L (ref 0–40)
Albumin: 4.9 g/dL (ref 4.1–5.1)
Alkaline Phosphatase: 60 IU/L (ref 44–121)
BUN/Creatinine Ratio: 18 (ref 9–20)
BUN: 22 mg/dL — ABNORMAL HIGH (ref 6–20)
Bilirubin Total: 0.3 mg/dL (ref 0.0–1.2)
CO2: 19 mmol/L — ABNORMAL LOW (ref 20–29)
Calcium: 9.7 mg/dL (ref 8.7–10.2)
Chloride: 102 mmol/L (ref 96–106)
Creatinine, Ser: 1.24 mg/dL (ref 0.76–1.27)
Globulin, Total: 1.9 g/dL (ref 1.5–4.5)
Glucose: 88 mg/dL (ref 70–99)
Potassium: 4.2 mmol/L (ref 3.5–5.2)
Sodium: 141 mmol/L (ref 134–144)
Total Protein: 6.8 g/dL (ref 6.0–8.5)
eGFR: 76 mL/min/{1.73_m2} (ref >59)

## 2023-03-07 LAB — VITAMIN D 25 HYDROXY (VIT D DEFICIENCY, FRACTURES): Vit D, 25-Hydroxy: 24.9 ng/mL — ABNORMAL LOW (ref 30.0–100.0)

## 2023-03-07 LAB — LIPID PANEL W/O CHOL/HDL RATIO
Cholesterol, Total: 242 mg/dL — ABNORMAL HIGH (ref 100–199)
HDL: 40 mg/dL (ref >39)
LDL Chol Calc (NIH): 176 mg/dL — ABNORMAL HIGH (ref 0–99)
Triglycerides: 142 mg/dL (ref 0–149)
VLDL Cholesterol Cal: 26 mg/dL (ref 5–40)

## 2023-03-07 LAB — MAGNESIUM: Magnesium: 2.3 mg/dL (ref 1.6–2.3)

## 2023-03-07 LAB — LAMOTRIGINE LEVEL: Lamotrigine Lvl: 4 ug/mL (ref 2.0–20.0)

## 2023-03-07 LAB — TSH: TSH: 0.734 u[IU]/mL (ref 0.450–4.500)

## 2023-03-17 ENCOUNTER — Ambulatory Visit: Payer: Managed Care, Other (non HMO)

## 2023-03-17 ENCOUNTER — Encounter: Payer: Self-pay | Admitting: Nurse Practitioner

## 2023-04-14 ENCOUNTER — Ambulatory Visit: Payer: Managed Care, Other (non HMO) | Admitting: Nurse Practitioner

## 2023-04-17 ENCOUNTER — Ambulatory Visit: Payer: Managed Care, Other (non HMO) | Admitting: Nurse Practitioner

## 2023-05-06 NOTE — Patient Instructions (Signed)
 Insomnia Insomnia is a sleep disorder that makes it difficult to fall asleep or stay asleep. Insomnia can cause fatigue, low energy, difficulty concentrating, mood swings, and poor performance at work or school. There are three different ways to classify insomnia: Difficulty falling asleep. Difficulty staying asleep. Waking up too early in the morning. Any type of insomnia can be long-term (chronic) or short-term (acute). Both are common. Short-term insomnia usually lasts for 3 months or less. Chronic insomnia occurs at least three times a week for longer than 3 months. What are the causes? Insomnia may be caused by another condition, situation, or substance, such as: Having certain mental health conditions, such as anxiety and depression. Using caffeine, alcohol , tobacco, or drugs. Having gastrointestinal conditions, such as gastroesophageal reflux disease (GERD). Having certain medical conditions. These include: Asthma. Alzheimer's disease. Stroke. Chronic pain. An overactive thyroid  gland (hyperthyroidism). Other sleep disorders, such as restless legs syndrome and sleep apnea. Menopause. Sometimes, the cause of insomnia may not be known. What increases the risk? Risk factors for insomnia include: Gender. Females are affected more often than males. Age. Insomnia is more common as people get older. Stress and certain medical and mental health conditions. Lack of exercise. Having an irregular work schedule. This may include working night shifts and traveling between different time zones. What are the signs or symptoms? If you have insomnia, the main symptom is having trouble falling asleep or having trouble staying asleep. This may lead to other symptoms, such as: Feeling tired or having low energy. Feeling nervous about going to sleep. Not feeling rested in the morning. Having trouble concentrating. Feeling irritable, anxious, or depressed. How is this diagnosed? This condition  may be diagnosed based on: Your symptoms and medical history. Your health care provider may ask about: Your sleep habits. Any medical conditions you have. Your mental health. A physical exam. How is this treated? Treatment for insomnia depends on the cause. Treatment may focus on treating an underlying condition that is causing the insomnia. Treatment may also include: Medicines to help you sleep. Counseling or therapy. Lifestyle adjustments to help you sleep better. Follow these instructions at home: Eating and drinking  Limit or avoid alcohol , caffeinated beverages, and products that contain nicotine and tobacco, especially close to bedtime. These can disrupt your sleep. Do not eat a large meal or eat spicy foods right before bedtime. This can lead to digestive discomfort that can make it hard for you to sleep. Sleep habits  Keep a sleep diary to help you and your health care provider figure out what could be causing your insomnia. Write down: When you sleep. When you wake up during the night. How well you sleep and how rested you feel the next day. Any side effects of medicines you are taking. What you eat and drink. Make your bedroom a dark, comfortable place where it is easy to fall asleep. Put up shades or blackout curtains to block light from outside. Use a white noise machine to block noise. Keep the temperature cool. Limit screen use before bedtime. This includes: Not watching TV. Not using your smartphone, tablet, or computer. Stick to a routine that includes going to bed and waking up at the same times every day and night. This can help you fall asleep faster. Consider making a quiet activity, such as reading, part of your nighttime routine. Try to avoid taking naps during the day so that you sleep better at night. Get out of bed if you are still awake after  15 minutes of trying to sleep. Keep the lights down, but try reading or doing a quiet activity. When you feel  sleepy, go back to bed. General instructions Take over-the-counter and prescription medicines only as told by your health care provider. Exercise regularly as told by your health care provider. However, avoid exercising in the hours right before bedtime. Use relaxation techniques to manage stress. Ask your health care provider to suggest some techniques that may work well for you. These may include: Breathing exercises. Routines to release muscle tension. Visualizing peaceful scenes. Make sure that you drive carefully. Do not drive if you feel very sleepy. Keep all follow-up visits. This is important. Contact a health care provider if: You are tired throughout the day. You have trouble in your daily routine due to sleepiness. You continue to have sleep problems, or your sleep problems get worse. Get help right away if: You have thoughts about hurting yourself or someone else. Get help right away if you feel like you may hurt yourself or others, or have thoughts about taking your own life. Go to your nearest emergency room or: Call 911. Call the National Suicide Prevention Lifeline at 2232757840 or 988. This is open 24 hours a day. Text the Crisis Text Line at 657-529-4371. Summary Insomnia is a sleep disorder that makes it difficult to fall asleep or stay asleep. Insomnia can be long-term (chronic) or short-term (acute). Treatment for insomnia depends on the cause. Treatment may focus on treating an underlying condition that is causing the insomnia. Keep a sleep diary to help you and your health care provider figure out what could be causing your insomnia. This information is not intended to replace advice given to you by your health care provider. Make sure you discuss any questions you have with your health care provider. Document Revised: 03/29/2021 Document Reviewed: 03/29/2021 Elsevier Patient Education  2024 [employer].

## 2023-05-12 ENCOUNTER — Encounter: Payer: Self-pay | Admitting: Nurse Practitioner

## 2023-05-12 ENCOUNTER — Ambulatory Visit: Payer: Managed Care, Other (non HMO) | Admitting: Nurse Practitioner

## 2023-05-12 VITALS — BP 131/76 | HR 92 | Temp 97.8°F | Ht 72.0 in | Wt 222.0 lb

## 2023-05-12 DIAGNOSIS — G2581 Restless legs syndrome: Secondary | ICD-10-CM | POA: Diagnosis not present

## 2023-05-12 DIAGNOSIS — F5101 Primary insomnia: Secondary | ICD-10-CM

## 2023-05-12 MED ORDER — GABAPENTIN 100 MG PO CAPS: ORAL_CAPSULE | ORAL | 1 refills | Status: DC

## 2023-05-12 NOTE — Assessment & Plan Note (Signed)
 At night he reports wife states he is moving legs a lot, suspect this causes some of his insomnia.  Will trial Gabapentin  100 MG every night and in one week if tolerating may increase to 200 MG.  Discussed with him to take his Lamictal  in morning and Gabapentin  at night to separate medications.

## 2023-05-12 NOTE — Progress Notes (Signed)
 BP 131/76   Pulse 92   Temp 97.8 F (36.6 C) (Oral)   Ht 6' (1.829 m)   Wt 222 lb (100.7 kg)   SpO2 97%   BMI 30.11 kg/m    Subjective:    Patient ID: Thomas Sawyer, male    DOB: 11/20/1985, 38 y.o.   MRN: [id number]  HPI: Thomas Sawyer is a 38 y.o. male  Chief Complaint  Patient presents with   Insomnia    Patient states the Trazadone worked for the first 4 to 5 days but then states it has been making him congested at night. States he was waking up around 3:00 every morning and he cannot go back to sleep    INSOMNIA Had trial of Trazodone  at visit on 03/03/23, helped for 4-5 days but then stopped working + got super congested -- got sick last Saturday.  He feels medicine made him congested prior to this.  Was struggling with sleep due to increased stressors.  Tried Melatonin in past but made him tired in morning, helped for brief period.  His legs are super restless at night.  Work and son are stressors.  Has history of seizures, takes at night before bed Lamictal . Duration: months Satisfied with sleep quality: no Difficulty falling asleep: yes Difficulty staying asleep: yes Waking a few hours after sleep onset: yes Early morning awakenings: yes Daytime hypersomnolence: no Wakes feeling refreshed: yes Good sleep hygiene: yes Apnea: no Snoring: no Depressed/anxious mood: yes Recent stress: yes Restless legs/nocturnal leg cramps: no Chronic pain/arthritis: no History of sleep study: no Treatments attempted: melatonin and Trazodone       05/12/2023   12:24 PM 03/03/2023    3:06 PM 02/18/2022    2:02 PM 02/12/2021    2:02 PM 02/07/2020    1:16 PM  Depression screen PHQ 2/9  Decreased Interest 1 1 0 0 0  Down, Depressed, Hopeless 1 0 2 0 0  PHQ - 2 Score 2 1 2  0 0  Altered sleeping 2 2 3  0   Tired, decreased energy 2 1 2  0   Change in appetite 0 0 1 0   Feeling bad or failure about yourself  0 0 2 0   Trouble concentrating 1 1 2  0   Moving slowly or fidgety/restless  1 2 3  0   Suicidal thoughts 0 0 0 0   PHQ-9 Score 8 7 15  0   Difficult doing work/chores Somewhat difficult  Not difficult at all Not difficult at all        03/03/2023    3:05 PM 02/18/2022    2:02 PM 02/12/2021    2:03 PM 12/28/2018    2:53 PM  GAD 7 : Generalized Anxiety Score  Nervous, Anxious, on Edge 2 2 2  0  Control/stop worrying 1 2 0 0  Worry too much - different things 2 3 0 1  Trouble relaxing 1 2 1 2   Restless 2 2 0 0  Easily annoyed or irritable 3 3 2 1   Afraid - awful might happen 0 0 0 1  Total GAD 7 Score 11 14 5 5   Anxiety Difficulty  Not difficult at all Not difficult at all Not difficult at all   Relevant past medical, surgical, family and social history reviewed and updated as indicated. Interim medical history since our last visit reviewed. Allergies and medications reviewed and updated.  Review of Systems  Constitutional:  Negative for activity change, diaphoresis, fatigue and fever.  Respiratory:  Negative for cough, chest tightness, shortness of breath and wheezing.   Cardiovascular:  Negative for chest pain, palpitations and leg swelling.  Neurological:  Negative for dizziness, seizures, syncope, weakness, light-headedness, numbness and headaches.  Psychiatric/Behavioral:  Positive for sleep disturbance. Negative for decreased concentration, self-injury and suicidal ideas. The patient is nervous/anxious.     Per HPI unless specifically indicated above     Objective:    BP 131/76   Pulse 92   Temp 97.8 F (36.6 C) (Oral)   Ht 6' (1.829 m)   Wt 222 lb (100.7 kg)   SpO2 97%   BMI 30.11 kg/m   Wt Readings from Last 3 Encounters:  05/12/23 222 lb (100.7 kg)  03/03/23 218 lb 9.6 oz (99.2 kg)  08/26/22 222 lb 14.4 oz (101.1 kg)    Physical Exam Vitals and nursing note reviewed.  Constitutional:      General: He is awake. He is not in acute distress.    Appearance: He is well-developed and well-groomed. He is not ill-appearing or toxic-appearing.   HENT:     Head: Normocephalic.     Right Ear: Hearing and external ear normal.     Left Ear: Hearing and external ear normal.  Eyes:     General: Lids are normal.     Extraocular Movements: Extraocular movements intact.     Conjunctiva/sclera: Conjunctivae normal.  Neck:     Thyroid: No thyromegaly.     Vascular: No carotid bruit.  Cardiovascular:     Rate and Rhythm: Normal rate and regular rhythm.     Heart sounds: Normal heart sounds. No murmur heard.    No gallop.  Pulmonary:     Effort: No accessory muscle usage or respiratory distress.     Breath sounds: Normal breath sounds.  Abdominal:     General: Bowel sounds are normal. There is no distension.     Palpations: Abdomen is soft.     Tenderness: There is no abdominal tenderness.  Musculoskeletal:     Cervical back: Full passive range of motion without pain.     Right lower leg: No edema.     Left lower leg: No edema.  Lymphadenopathy:     Cervical: No cervical adenopathy.  Skin:    General: Skin is warm.     Capillary Refill: Capillary refill takes less than 2 seconds.  Neurological:     Mental Status: He is alert and oriented to person, place, and time.     Deep Tendon Reflexes: Reflexes are normal and symmetric.     Reflex Scores:      Brachioradialis reflexes are 2+ on the right side and 2+ on the left side.      Patellar reflexes are 2+ on the right side and 2+ on the left side. Psychiatric:        Attention and Perception: Attention normal.        Mood and Affect: Mood normal.        Speech: Speech normal.        Behavior: Behavior normal. Behavior is cooperative.        Thought Content: Thought content normal.    Results for orders placed or performed in visit on 03/03/23  CBC with Differential/Platelet   Collection Time: 03/03/23  3:11 PM  Result Value Ref Range   WBC 7.5 3.4 - 10.8 x10E3/uL   RBC 4.39 4.14 - 5.80 x10E6/uL   Hemoglobin 13.9 13.0 - 17.7 g/dL   Hematocrit 58.1 62.4 -  51.0 %   MCV 95  79 - 97 fL   MCH 31.7 26.6 - 33.0 pg   MCHC 33.3 31.5 - 35.7 g/dL   RDW 87.7 88.3 - 84.5 %   Platelets 335 150 - 450 x10E3/uL   Neutrophils 66 Not Estab. %   Lymphs 26 Not Estab. %   Monocytes 6 Not Estab. %   Eos 1 Not Estab. %   Basos 1 Not Estab. %   Neutrophils Absolute 4.9 1.4 - 7.0 x10E3/uL   Lymphocytes Absolute 2.0 0.7 - 3.1 x10E3/uL   Monocytes Absolute 0.5 0.1 - 0.9 x10E3/uL   EOS (ABSOLUTE) 0.1 0.0 - 0.4 x10E3/uL   Basophils Absolute 0.1 0.0 - 0.2 x10E3/uL   Immature Granulocytes 0 Not Estab. %   Immature Grans (Abs) 0.0 0.0 - 0.1 x10E3/uL  Comprehensive metabolic panel   Collection Time: 03/03/23  3:11 PM  Result Value Ref Range   Glucose 88 70 - 99 mg/dL   BUN 22 (H) 6 - 20 mg/dL   Creatinine, Ser 8.75 0.76 - 1.27 mg/dL   eGFR 76 >40 fO/fpw/8.26   BUN/Creatinine Ratio 18 9 - 20   Sodium 141 134 - 144 mmol/L   Potassium 4.2 3.5 - 5.2 mmol/L   Chloride 102 96 - 106 mmol/L   CO2 19 (L) 20 - 29 mmol/L   Calcium 9.7 8.7 - 10.2 mg/dL   Total Protein 6.8 6.0 - 8.5 g/dL   Albumin 4.9 4.1 - 5.1 g/dL   Globulin, Total 1.9 1.5 - 4.5 g/dL   Bilirubin Total 0.3 0.0 - 1.2 mg/dL   Alkaline Phosphatase 60 44 - 121 IU/L   AST 20 0 - 40 IU/L   ALT 29 0 - 44 IU/L  Lipid Panel w/o Chol/HDL Ratio   Collection Time: 03/03/23  3:11 PM  Result Value Ref Range   Cholesterol, Total 242 (H) 100 - 199 mg/dL   Triglycerides 857 0 - 149 mg/dL   HDL 40 >60 mg/dL   VLDL Cholesterol Cal 26 5 - 40 mg/dL   LDL Chol Calc (NIH) 823 (H) 0 - 99 mg/dL  TSH   Collection Time: 03/03/23  3:11 PM  Result Value Ref Range   TSH 0.734 0.450 - 4.500 uIU/mL  VITAMIN D  25 Hydroxy (Vit-D Deficiency, Fractures)   Collection Time: 03/03/23  3:11 PM  Result Value Ref Range   Vit D, 25-Hydroxy 24.9 (L) 30.0 - 100.0 ng/mL  Magnesium   Collection Time: 03/03/23  3:11 PM  Result Value Ref Range   Magnesium 2.3 1.6 - 2.3 mg/dL  Lamotrigine  level   Collection Time: 03/03/23  3:11 PM  Result Value Ref  Range   Lamotrigine  Lvl 4.0 2.0 - 20.0 ug/mL      Assessment & Plan:   Problem List Items Addressed This Visit       Other   Primary insomnia - Primary   Chronic, ongoing issue due to stressors work and with son.  No benefit from Melatonin.  Trazodone  caused congestion and stopped working.  Does report some RLS at night.  Will trial Gabapentin  100 MG every night and in one week if tolerating may increase to 200 MG.  Discussed with him to take his Lamictal  in morning and Gabapentin  at night to separate medications.   ?Maintain a regular sleep schedule, particularly a regular wake-up time in the morning ?Try not to force sleep ?Avoid caffeinated beverages after lunch ?Avoid alcohol near bedtime (eg, late afternoon and evening)  ?Avoid  smoking or other nicotine intake, particularly during the evening ?Adjust the bedroom environment as needed to decrease stimuli (eg, reduce ambient light, turn off the television or radio) ?Avoid prolonged use of light-emitting screens (laptops, tablets, smartphones, ebooks) before bedtime  ?Resolve concerns or worries before bedtime ?Exercise regularly for at least 20 minutes, preferably more than four to five hours prior to bedtime  ?Avoid daytime naps, especially if they are longer than 20 to 30 minutes or occur late in the day       Restless leg syndrome   At night he reports wife states he is moving legs a lot, suspect this causes some of his insomnia.  Will trial Gabapentin  100 MG every night and in one week if tolerating may increase to 200 MG.  Discussed with him to take his Lamictal  in morning and Gabapentin  at night to separate medications.          Follow up plan: Return in about 6 weeks (around 06/23/2023) for Insomnia.

## 2023-05-12 NOTE — Assessment & Plan Note (Signed)
 Chronic, ongoing issue due to stressors work and with son.  No benefit from Melatonin.  Trazodone  caused congestion and stopped working.  Does report some RLS at night.  Will trial Gabapentin  100 MG every night and in one week if tolerating may increase to 200 MG.  Discussed with him to take his Lamictal  in morning and Gabapentin  at night to separate medications.   ?Maintain a regular sleep schedule, particularly a regular wake-up time in the morning ?Try not to force sleep ?Avoid caffeinated beverages after lunch ?Avoid alcohol near bedtime (eg, late afternoon and evening)  ?Avoid smoking or other nicotine intake, particularly during the evening ?Adjust the bedroom environment as needed to decrease stimuli (eg, reduce ambient light, turn off the television or radio) ?Avoid prolonged use of light-emitting screens (laptops, tablets, smartphones, ebooks) before bedtime  ?Resolve concerns or worries before bedtime ?Exercise regularly for at least 20 minutes, preferably more than four to five hours prior to bedtime  ?Avoid daytime naps, especially if they are longer than 20 to 30 minutes or occur late in the day

## 2023-06-30 ENCOUNTER — Ambulatory Visit: Payer: Managed Care, Other (non HMO) | Admitting: Nurse Practitioner

## 2023-07-09 NOTE — Patient Instructions (Signed)
 Insomnia Insomnia is a sleep disorder that makes it difficult to fall asleep or stay asleep. Insomnia can cause fatigue, low energy, difficulty concentrating, mood swings, and poor performance at work or school. There are three different ways to classify insomnia: Difficulty falling asleep. Difficulty staying asleep. Waking up too early in the morning. Any type of insomnia can be long-term (chronic) or short-term (acute). Both are common. Short-term insomnia usually lasts for 3 months or less. Chronic insomnia occurs at least three times a week for longer than 3 months. What are the causes? Insomnia may be caused by another condition, situation, or substance, such as: Having certain mental health conditions, such as anxiety and depression. Using caffeine, alcohol, tobacco, or drugs. Having gastrointestinal conditions, such as gastroesophageal reflux disease (GERD). Having certain medical conditions. These include: Asthma. Alzheimer's disease. Stroke. Chronic pain. An overactive thyroid gland (hyperthyroidism). Other sleep disorders, such as restless legs syndrome and sleep apnea. Menopause. Sometimes, the cause of insomnia may not be known. What increases the risk? Risk factors for insomnia include: Gender. Females are affected more often than males. Age. Insomnia is more common as people get older. Stress and certain medical and mental health conditions. Lack of exercise. Having an irregular work schedule. This may include working night shifts and traveling between different time zones. What are the signs or symptoms? If you have insomnia, the main symptom is having trouble falling asleep or having trouble staying asleep. This may lead to other symptoms, such as: Feeling tired or having low energy. Feeling nervous about going to sleep. Not feeling rested in the morning. Having trouble concentrating. Feeling irritable, anxious, or depressed. How is this diagnosed? This condition  may be diagnosed based on: Your symptoms and medical history. Your health care provider may ask about: Your sleep habits. Any medical conditions you have. Your mental health. A physical exam. How is this treated? Treatment for insomnia depends on the cause. Treatment may focus on treating an underlying condition that is causing the insomnia. Treatment may also include: Medicines to help you sleep. Counseling or therapy. Lifestyle adjustments to help you sleep better. Follow these instructions at home: Eating and drinking  Limit or avoid alcohol, caffeinated beverages, and products that contain nicotine and tobacco, especially close to bedtime. These can disrupt your sleep. Do not eat a large meal or eat spicy foods right before bedtime. This can lead to digestive discomfort that can make it hard for you to sleep. Sleep habits  Keep a sleep diary to help you and your health care provider figure out what could be causing your insomnia. Write down: When you sleep. When you wake up during the night. How well you sleep and how rested you feel the next day. Any side effects of medicines you are taking. What you eat and drink. Make your bedroom a dark, comfortable place where it is easy to fall asleep. Put up shades or blackout curtains to block light from outside. Use a white noise machine to block noise. Keep the temperature cool. Limit screen use before bedtime. This includes: Not watching TV. Not using your smartphone, tablet, or computer. Stick to a routine that includes going to bed and waking up at the same times every day and night. This can help you fall asleep faster. Consider making a quiet activity, such as reading, part of your nighttime routine. Try to avoid taking naps during the day so that you sleep better at night. Get out of bed if you are still awake after  15 minutes of trying to sleep. Keep the lights down, but try reading or doing a quiet activity. When you feel  sleepy, go back to bed. General instructions Take over-the-counter and prescription medicines only as told by your health care provider. Exercise regularly as told by your health care provider. However, avoid exercising in the hours right before bedtime. Use relaxation techniques to manage stress. Ask your health care provider to suggest some techniques that may work well for you. These may include: Breathing exercises. Routines to release muscle tension. Visualizing peaceful scenes. Make sure that you drive carefully. Do not drive if you feel very sleepy. Keep all follow-up visits. This is important. Contact a health care provider if: You are tired throughout the day. You have trouble in your daily routine due to sleepiness. You continue to have sleep problems, or your sleep problems get worse. Get help right away if: You have thoughts about hurting yourself or someone else. Get help right away if you feel like you may hurt yourself or others, or have thoughts about taking your own life. Go to your nearest emergency room or: Call 911. Call the National Suicide Prevention Lifeline at (906)021-1611 or 988. This is open 24 hours a day. Text the Crisis Text Line at 325-069-2793. Summary Insomnia is a sleep disorder that makes it difficult to fall asleep or stay asleep. Insomnia can be long-term (chronic) or short-term (acute). Treatment for insomnia depends on the cause. Treatment may focus on treating an underlying condition that is causing the insomnia. Keep a sleep diary to help you and your health care provider figure out what could be causing your insomnia. This information is not intended to replace advice given to you by your health care provider. Make sure you discuss any questions you have with your health care provider. Document Revised: 03/29/2021 Document Reviewed: 03/29/2021 Elsevier Patient Education  2024 [employer].

## 2023-07-14 ENCOUNTER — Ambulatory Visit: Payer: Managed Care, Other (non HMO) | Admitting: Nurse Practitioner

## 2023-07-14 ENCOUNTER — Encounter: Payer: Self-pay | Admitting: Nurse Practitioner

## 2023-07-14 VITALS — BP 107/64 | HR 68 | Temp 97.7°F | Ht 72.0 in | Wt 220.8 lb

## 2023-07-14 DIAGNOSIS — F5101 Primary insomnia: Secondary | ICD-10-CM

## 2023-07-14 MED ORDER — GABAPENTIN 300 MG PO CAPS: 300.0000 mg | ORAL_CAPSULE | Freq: Every day | ORAL | 1 refills | Status: DC

## 2023-07-14 NOTE — Assessment & Plan Note (Signed)
 Chronic, ongoing issue due to stressors work and with son.  No benefit from Melatonin.  Trazodone caused congestion and stopped working.  Does report some RLS at night.  Gabapentin 200 MG has been helping some.  Will increase to 300 MG to see if can get consistent good sleep pattern, this may also help with anxiety.  Discussed with him to take his Lamictal in morning and Gabapentin at night to separate medications.   ?Maintain a regular sleep schedule, particularly a regular wake-up time in the morning ?Try not to force sleep ?Avoid caffeinated beverages after lunch ?Avoid alcohol near bedtime (eg, late afternoon and evening)  ?Avoid smoking or other nicotine intake, particularly during the evening ?Adjust the bedroom environment as needed to decrease stimuli (eg, reduce ambient light, turn off the television or radio) ?Avoid prolonged use of light-emitting screens (laptops, tablets, smartphones, ebooks) before bedtime  ?Resolve concerns or worries before bedtime ?Exercise regularly for at least 20 minutes, preferably more than four to five hours prior to bedtime  ?Avoid daytime naps, especially if they are longer than 20 to 30 minutes or occur late in the day

## 2023-07-14 NOTE — Progress Notes (Signed)
 BP 107/64   Pulse 68   Temp 97.7 F (36.5 C) (Oral)   Ht 6' (1.829 m)   Wt 220 lb 12.8 oz (100.2 kg)   SpO2 98%   BMI 29.95 kg/m    Subjective:    Patient ID: Thomas Sawyer, male    DOB: 1986-01-23, 38 y.o.   MRN: [id number]  HPI: Thomas Sawyer is a 38 y.o. male  Chief Complaint  Patient presents with   Insomnia    6 week f/up   INSOMNIA Follow-up today for insomnia.  Started Gabapentin 100 MG at bedtime.  Varies night to night on sleeping, he reports it is going to the upside so far. Wife notices he has been sleeping strong.  He is having more good than bad days. Duration: months Satisfied with sleep quality: yes Difficulty falling asleep: yes Difficulty staying asleep: yes Waking a few hours after sleep onset: yes Early morning awakenings: yes Daytime hypersomnolence: no Wakes feeling refreshed: yes Good sleep hygiene: yes Apnea: no Snoring: no Depressed/anxious mood: yes Recent stress: yes Restless legs/nocturnal leg cramps: no Chronic pain/arthritis: no History of sleep study: no Treatments attempted: melatonin and Trazodone   Relevant past medical, surgical, family and social history reviewed and updated as indicated. Interim medical history since our last visit reviewed. Allergies and medications reviewed and updated.  Review of Systems  Constitutional:  Negative for activity change, diaphoresis, fatigue and fever.  Respiratory:  Negative for cough, chest tightness, shortness of breath and wheezing.   Cardiovascular:  Negative for chest pain, palpitations and leg swelling.  Neurological:  Negative for dizziness, seizures, syncope, weakness, light-headedness, numbness and headaches.  Psychiatric/Behavioral:  Positive for sleep disturbance. Negative for decreased concentration, self-injury and suicidal ideas. The patient is nervous/anxious.     Per HPI unless specifically indicated above     Objective:    BP 107/64   Pulse 68   Temp 97.7 F (36.5 C)  (Oral)   Ht 6' (1.829 m)   Wt 220 lb 12.8 oz (100.2 kg)   SpO2 98%   BMI 29.95 kg/m   Wt Readings from Last 3 Encounters:  07/14/23 220 lb 12.8 oz (100.2 kg)  05/12/23 222 lb (100.7 kg)  03/03/23 218 lb 9.6 oz (99.2 kg)    Physical Exam Vitals and nursing note reviewed.  Constitutional:      General: He is awake. He is not in acute distress.    Appearance: He is well-developed and well-groomed. He is not ill-appearing or toxic-appearing.  HENT:     Head: Normocephalic.     Right Ear: Hearing and external ear normal.     Left Ear: Hearing and external ear normal.  Eyes:     General: Lids are normal.     Extraocular Movements: Extraocular movements intact.     Conjunctiva/sclera: Conjunctivae normal.  Neck:     Thyroid: No thyromegaly.     Vascular: No carotid bruit.  Cardiovascular:     Rate and Rhythm: Normal rate and regular rhythm.     Heart sounds: Normal heart sounds. No murmur heard.    No gallop.  Pulmonary:     Effort: No accessory muscle usage or respiratory distress.     Breath sounds: Normal breath sounds.  Abdominal:     General: Bowel sounds are normal. There is no distension.     Palpations: Abdomen is soft.     Tenderness: There is no abdominal tenderness.  Musculoskeletal:     Cervical back:  Full passive range of motion without pain.     Right lower leg: No edema.     Left lower leg: No edema.  Lymphadenopathy:     Cervical: No cervical adenopathy.  Skin:    General: Skin is warm.     Capillary Refill: Capillary refill takes less than 2 seconds.  Neurological:     Mental Status: He is alert and oriented to person, place, and time.     Deep Tendon Reflexes: Reflexes are normal and symmetric.     Reflex Scores:      Brachioradialis reflexes are 2+ on the right side and 2+ on the left side.      Patellar reflexes are 2+ on the right side and 2+ on the left side. Psychiatric:        Attention and Perception: Attention normal.        Mood and Affect:  Mood normal.        Speech: Speech normal.        Behavior: Behavior normal. Behavior is cooperative.        Thought Content: Thought content normal.     Results for orders placed or performed in visit on 03/03/23  CBC with Differential/Platelet   Collection Time: 03/03/23  3:11 PM  Result Value Ref Range   WBC 7.5 3.4 - 10.8 x10E3/uL   RBC 4.39 4.14 - 5.80 x10E6/uL   Hemoglobin 13.9 13.0 - 17.7 g/dL   Hematocrit 08.6 57.8 - 51.0 %   MCV 95 79 - 97 fL   MCH 31.7 26.6 - 33.0 pg   MCHC 33.3 31.5 - 35.7 g/dL   RDW 46.9 62.9 - 52.8 %   Platelets 335 150 - 450 x10E3/uL   Neutrophils 66 Not Estab. %   Lymphs 26 Not Estab. %   Monocytes 6 Not Estab. %   Eos 1 Not Estab. %   Basos 1 Not Estab. %   Neutrophils Absolute 4.9 1.4 - 7.0 x10E3/uL   Lymphocytes Absolute 2.0 0.7 - 3.1 x10E3/uL   Monocytes Absolute 0.5 0.1 - 0.9 x10E3/uL   EOS (ABSOLUTE) 0.1 0.0 - 0.4 x10E3/uL   Basophils Absolute 0.1 0.0 - 0.2 x10E3/uL   Immature Granulocytes 0 Not Estab. %   Immature Grans (Abs) 0.0 0.0 - 0.1 x10E3/uL  Comprehensive metabolic panel   Collection Time: 03/03/23  3:11 PM  Result Value Ref Range   Glucose 88 70 - 99 mg/dL   BUN 22 (H) 6 - 20 mg/dL   Creatinine, Ser 4.13 0.76 - 1.27 mg/dL   eGFR 76 >24 MW/NUU/7.25   BUN/Creatinine Ratio 18 9 - 20   Sodium 141 134 - 144 mmol/L   Potassium 4.2 3.5 - 5.2 mmol/L   Chloride 102 96 - 106 mmol/L   CO2 19 (L) 20 - 29 mmol/L   Calcium 9.7 8.7 - 10.2 mg/dL   Total Protein 6.8 6.0 - 8.5 g/dL   Albumin 4.9 4.1 - 5.1 g/dL   Globulin, Total 1.9 1.5 - 4.5 g/dL   Bilirubin Total 0.3 0.0 - 1.2 mg/dL   Alkaline Phosphatase 60 44 - 121 IU/L   AST 20 0 - 40 IU/L   ALT 29 0 - 44 IU/L  Lipid Panel w/o Chol/HDL Ratio   Collection Time: 03/03/23  3:11 PM  Result Value Ref Range   Cholesterol, Total 242 (H) 100 - 199 mg/dL   Triglycerides 366 0 - 149 mg/dL   HDL 40 >44 mg/dL   VLDL Cholesterol Cal 26  5 - 40 mg/dL   LDL Chol Calc (NIH) 098 (H) 0 - 99  mg/dL  TSH   Collection Time: 03/03/23  3:11 PM  Result Value Ref Range   TSH 0.734 0.450 - 4.500 uIU/mL  VITAMIN D 25 Hydroxy (Vit-D Deficiency, Fractures)   Collection Time: 03/03/23  3:11 PM  Result Value Ref Range   Vit D, 25-Hydroxy 24.9 (L) 30.0 - 100.0 ng/mL  Magnesium   Collection Time: 03/03/23  3:11 PM  Result Value Ref Range   Magnesium 2.3 1.6 - 2.3 mg/dL  Lamotrigine level   Collection Time: 03/03/23  3:11 PM  Result Value Ref Range   Lamotrigine Lvl 4.0 2.0 - 20.0 ug/mL      Assessment & Plan:   Problem List Items Addressed This Visit       Other   Primary insomnia - Primary   Chronic, ongoing issue due to stressors work and with son.  No benefit from Melatonin.  Trazodone caused congestion and stopped working.  Does report some RLS at night.  Gabapentin 200 MG has been helping some.  Will increase to 300 MG to see if can get consistent good sleep pattern, this may also help with anxiety.  Discussed with him to take his Lamictal in morning and Gabapentin at night to separate medications.   ?Maintain a regular sleep schedule, particularly a regular wake-up time in the morning ?Try not to force sleep ?Avoid caffeinated beverages after lunch ?Avoid alcohol near bedtime (eg, late afternoon and evening)  ?Avoid smoking or other nicotine intake, particularly during the evening ?Adjust the bedroom environment as needed to decrease stimuli (eg, reduce ambient light, turn off the television or radio) ?Avoid prolonged use of light-emitting screens (laptops, tablets, smartphones, ebooks) before bedtime  ?Resolve concerns or worries before bedtime ?Exercise regularly for at least 20 minutes, preferably more than four to five hours prior to bedtime  ?Avoid daytime naps, especially if they are longer than 20 to 30 minutes or occur late in the day         Follow up plan: Return in about 8 weeks (around 09/08/2023) for Insomnia.

## 2023-08-18 ENCOUNTER — Ambulatory Visit
Admission: RE | Admit: 2023-08-18 | Discharge: 2023-08-18 | Disposition: A | Discharge status: Home / Self Care | Source: Ambulatory Visit | Attending: Emergency Medicine | Admitting: Emergency Medicine

## 2023-08-18 VITALS — BP 123/76 | HR 63 | Temp 98.3°F | Resp 16 | Ht 72.0 in | Wt 220.9 lb

## 2023-08-18 DIAGNOSIS — J029 Acute pharyngitis, unspecified: Secondary | ICD-10-CM

## 2023-08-18 LAB — GROUP A STREP BY PCR: Group A Strep by PCR: NOT DETECTED

## 2023-08-18 MED ORDER — IPRATROPIUM BROMIDE 0.06 % NA SOLN: 2.0000 | Freq: Four times a day (QID) | NASAL | 12 refills | Status: DC

## 2023-08-18 NOTE — Discharge Instructions (Signed)
 Your strep test today was negative.  The source of your sore throat is unclear though it may be coming from your postnasal drip.  Use the Atrovent  nasal spray, 2 squirts in each nostril every 6 hours, to help with nasal congestion and postnasal drip.  Start taking an over-the-counter antihistamine such as Claritin, Zyrtec, or Allegra.  Follow the package instructions.  You may also use over-the-counter Tylenol and/or ibuprofen according the package instructions as needed for any pain.  Over-the-counter Chloraseptic or Sucrets lozenges may also help you with the pain.  No more than 1 lozenge every 2 hours as the menthol may give you diarrhea.  Salt water gargles may also be helpful to soothe your sore throat tissues as well as wash away any drainage which is causing irritation.  If you continue to experience pain on the right side of your neck, or if the pain increases, I recommend you follow-up with your primary care provider to discuss imaging.  If you develop any visible swelling or feel as though you are developing tightness in your throat I recommend you go to the ER for evaluation.

## 2023-08-18 NOTE — ED Triage Notes (Signed)
 Pt c/o sore throat on the right side. Started about 3 days ago. Denies fever. He states he does not feel sick and his thrat does not hurt when he swallows.

## 2023-08-18 NOTE — ED Provider Notes (Signed)
 [facility]    CSN: [id number] Arrival date & time: 08/18/23  1107      History   Chief Complaint Chief Complaint  Patient presents with   Sore Throat   Appointment    HPI Thomas Sawyer is a 38 y.o. male.   HPI  38 year old male with past medical history significant for oppositional defiant disorder, hiatal hernia, genital warts, depression, anxiety, and ADHD presents for evaluation of right sided sore throat.  He reports that the pain began 3 days ago and it worsened this morning.  However, it is not painful to swallow.  He does endorse some nasal congestion which she attributes to the pollen as well as some slight pressure in his right ear.  He denies any fever.  He also denies cough.  He does use chewing tobacco and states that he spits out the byproducts and does not swallow them.  Past Medical History:  Diagnosis Date   ADHD (attention deficit hyperactivity disorder)    Anxiety    Depression    Depression    Genital warts    Hiatal hernia    Oppositional defiant behavior     Patient Active Problem List   Diagnosis Date Noted   Restless leg syndrome 05/12/2023   Vitamin D  deficiency 02/20/2022   Hiatal hernia 02/18/2022   Seizure disorder (HCC) 02/13/2022   Elevated low density lipoprotein (LDL) cholesterol level 02/13/2022   Personal history of benign brain tumor 11/30/2021   Gastroesophageal reflux disease 05/14/2020   Obesity 02/07/2020   HSV (herpes simplex virus) infection 12/27/2017   Hx of substance abuse (HCC) 11/13/2017   Primary insomnia 11/08/2017   Chewing tobacco use 07/17/2015   Genital warts    Situational depression    History of ADHD     Past Surgical History:  Procedure Laterality Date   BRAIN SURGERY  05/02/2009   tumor removal       Home Medications    Prior to Admission medications   Medication Sig Start Date End Date Taking? Authorizing Provider  ipratropium (ATROVENT ) 0.06 % nasal spray Place 2 sprays into both  nostrils 4 (four) times daily. 08/18/23  Yes Kent Pear, NP  gabapentin  (NEURONTIN ) 300 MG capsule Take 1 capsule (300 mg total) by mouth at bedtime. 07/14/23  Yes Cannady, Jolene T, NP  LamoTRIgine  300 MG TB24 24 hour tablet Take 1 tablet by mouth daily. 04/21/23  Yes [provider]  valACYclovir  (VALTREX ) 1000 MG tablet Take 1 tablet (1,000 mg total) by mouth 2 (two) times daily as needed. 01/16/23  Yes Cannady, Jolene T, NP    Family History Family History  Problem Relation Age of Onset   Asthma Father    Diabetes Father    Cancer - Lung Father    Cancer Neg Hx    COPD Neg Hx    Heart disease Neg Hx    Hypertension Neg Hx    Stroke Neg Hx    Prostate cancer Neg Hx    Sickle cell trait Neg Hx    Bladder Cancer Neg Hx    Kidney cancer Neg Hx     Social History Social History   Tobacco Use   Smoking status: Former    Current packs/day: 0.50    Average packs/day: 0.5 packs/day for 18.0 years (9.0 ttl pk-yrs)    Types: Cigarettes   Smokeless tobacco: Current    Types: Snuff  Vaping Use   Vaping status: Former  Substance Use Topics  Alcohol use: Not Currently    Comment: rare   Drug use: Not Currently    Types: Marijuana     Allergies   Patient has no known allergies.   Review of Systems Review of Systems  Constitutional:  Negative for fever.  HENT:  Positive for congestion, ear pain and sore throat. Negative for rhinorrhea.   Respiratory:  Negative for cough.   Hematological:  Negative for adenopathy.     Physical Exam Triage Vital Signs ED Triage Vitals  Encounter Vitals Group     BP      Systolic BP Percentile      Diastolic BP Percentile      Pulse      Resp      Temp      Temp src      SpO2      Weight      Height      Head Circumference      Peak Flow      Pain Score      Pain Loc      Pain Education      Exclude from Growth Chart    No data found.  Updated Vital Signs BP 123/76 (BP Location: Right Arm)   Pulse 63   Temp  98.3 F (36.8 C) (Oral)   Resp 16   Ht 6' (1.829 m)   Wt 220 lb 14.4 oz (100.2 kg)   SpO2 97%   BMI 29.96 kg/m   Visual Acuity Right Eye Distance:   Left Eye Distance:   Bilateral Distance:    Right Eye Near:   Left Eye Near:    Bilateral Near:     Physical Exam Vitals and nursing note reviewed.  Constitutional:      Appearance: Normal appearance. He is not ill-appearing.  HENT:     Head: Normocephalic and atraumatic.     Right Ear: Tympanic membrane, ear canal and external ear normal. There is no impacted cerumen.     Nose: Congestion and rhinorrhea present.     Comments: Nasal mucosa is erythematous and edematous with scant clear discharge in both nares.    Mouth/Throat:     Mouth: Mucous membranes are moist.     Pharynx: Oropharynx is clear. Posterior oropharyngeal erythema present. No oropharyngeal exudate.     Comments: Tonsillar pillars are erythematous but free of edema or exudate.  Posterior oropharynx also demonstrates mild erythema with clear postnasal drip. Neck:     Comments: Tenderness to palpation of the right anterior cervical region without appreciable lymphadenopathy. Musculoskeletal:     Cervical back: Normal range of motion and neck supple. Tenderness present.  Lymphadenopathy:     Cervical: No cervical adenopathy.  Skin:    General: Skin is warm and dry.     Capillary Refill: Capillary refill takes less than 2 seconds.     Findings: No rash.  Neurological:     General: No focal deficit present.     Mental Status: He is alert and oriented to person, place, and time.      UC Treatments / Results  Labs (all labs ordered are listed, but only abnormal results are displayed) Labs Reviewed  GROUP A STREP BY PCR    EKG   Radiology No results found.  Procedures Procedures (including critical care time)  Medications Ordered in UC Medications - No data to display  Initial Impression / Assessment and Plan / UC Course  I have reviewed the  triage vital  signs and the nursing notes.  Pertinent labs & imaging results that were available during my care of the patient were reviewed by me and considered in my medical decision making (see chart for details).   Patient is a pleasant, nontoxic-appearing 38 year old male presenting for evaluation of right-sided sore throat as outlined in HPI above.  He does have some mild inflammation to his posterior oropharynx without tonsillar hypertrophy or exudate.  Anterior cervical region on the right is tender to palpation without appreciable lymphadenopathy.  He does have nasal congestion as well.  Differential diagnosis include viral pharyngitis, allergic rhinitis with postnasal drip, strep pharyngitis, mucosal irritation from oral tobacco products, throat cancer.  Given that I do not appreciate any masses I am less suspect of head neck cancers though I did explain to the patient that he is at increased risk because he does use chewing tobacco.  I also do not appreciate any cervical lymphadenopathy.  I will obtain a strep PCR.  Strep PCR is negative.  I will discharge patient home with a diagnosis of pharyngitis.  I will prescribe Atrovent  nasal spray to help with nasal congestion and postnasal drip and have him start taking an over-the-counter antihistamine such as Claritin, Zyrtec, or Allegra.  If he continues to experience pain on the right side of his neck he should follow-up with his primary care provider to discuss imaging.     Final Clinical Impressions(s) / UC Diagnoses   Final diagnoses:  Pharyngitis, unspecified etiology     Discharge Instructions      Your strep test today was negative.  The source of your sore throat is unclear though it may be coming from your postnasal drip.  Use the Atrovent  nasal spray, 2 squirts in each nostril every 6 hours, to help with nasal congestion and postnasal drip.  Start taking an over-the-counter antihistamine such as Claritin, Zyrtec, or Allegra.   Follow the package instructions.  You may also use over-the-counter Tylenol and/or ibuprofen according the package instructions as needed for any pain.  Over-the-counter Chloraseptic or Sucrets lozenges may also help you with the pain.  No more than 1 lozenge every 2 hours as the menthol may give you diarrhea.  Salt water gargles may also be helpful to soothe your sore throat tissues as well as wash away any drainage which is causing irritation.  If you continue to experience pain on the right side of your neck, or if the pain increases, I recommend you follow-up with your primary care provider to discuss imaging.  If you develop any visible swelling or feel as though you are developing tightness in your throat I recommend you go to the ER for evaluation.     ED Prescriptions     Medication Sig Dispense Auth. Provider   ipratropium (ATROVENT ) 0.06 % nasal spray Place 2 sprays into both nostrils 4 (four) times daily. 15 mL Kent Pear, NP      PDMP not reviewed this encounter.   Kent Pear, NP 08/18/23 1204

## 2023-09-03 ENCOUNTER — Encounter: Payer: Self-pay | Admitting: Nurse Practitioner

## 2023-09-08 ENCOUNTER — Ambulatory Visit: Admitting: Nurse Practitioner

## 2023-09-11 ENCOUNTER — Ambulatory Visit: Admitting: Nurse Practitioner

## 2023-09-17 NOTE — Patient Instructions (Signed)
 Insomnia Insomnia is a sleep disorder that makes it difficult to fall asleep or stay asleep. Insomnia can cause fatigue, low energy, difficulty concentrating, mood swings, and poor performance at work or school. There are three different ways to classify insomnia: Difficulty falling asleep. Difficulty staying asleep. Waking up too early in the morning. Any type of insomnia can be long-term (chronic) or short-term (acute). Both are common. Short-term insomnia usually lasts for 3 months or less. Chronic insomnia occurs at least three times a week for longer than 3 months. What are the causes? Insomnia may be caused by another condition, situation, or substance, such as: Having certain mental health conditions, such as anxiety and depression. Using caffeine, alcohol, tobacco, or drugs. Having gastrointestinal conditions, such as gastroesophageal reflux disease (GERD). Having certain medical conditions. These include: Asthma. Alzheimer's disease. Stroke. Chronic pain. An overactive thyroid gland (hyperthyroidism). Other sleep disorders, such as restless legs syndrome and sleep apnea. Menopause. Sometimes, the cause of insomnia may not be known. What increases the risk? Risk factors for insomnia include: Gender. Females are affected more often than males. Age. Insomnia is more common as people get older. Stress and certain medical and mental health conditions. Lack of exercise. Having an irregular work schedule. This may include working night shifts and traveling between different time zones. What are the signs or symptoms? If you have insomnia, the main symptom is having trouble falling asleep or having trouble staying asleep. This may lead to other symptoms, such as: Feeling tired or having low energy. Feeling nervous about going to sleep. Not feeling rested in the morning. Having trouble concentrating. Feeling irritable, anxious, or depressed. How is this diagnosed? This condition  may be diagnosed based on: Your symptoms and medical history. Your health care provider may ask about: Your sleep habits. Any medical conditions you have. Your mental health. A physical exam. How is this treated? Treatment for insomnia depends on the cause. Treatment may focus on treating an underlying condition that is causing the insomnia. Treatment may also include: Medicines to help you sleep. Counseling or therapy. Lifestyle adjustments to help you sleep better. Follow these instructions at home: Eating and drinking  Limit or avoid alcohol, caffeinated beverages, and products that contain nicotine and tobacco, especially close to bedtime. These can disrupt your sleep. Do not eat a large meal or eat spicy foods right before bedtime. This can lead to digestive discomfort that can make it hard for you to sleep. Sleep habits  Keep a sleep diary to help you and your health care provider figure out what could be causing your insomnia. Write down: When you sleep. When you wake up during the night. How well you sleep and how rested you feel the next day. Any side effects of medicines you are taking. What you eat and drink. Make your bedroom a dark, comfortable place where it is easy to fall asleep. Put up shades or blackout curtains to block light from outside. Use a white noise machine to block noise. Keep the temperature cool. Limit screen use before bedtime. This includes: Not watching TV. Not using your smartphone, tablet, or computer. Stick to a routine that includes going to bed and waking up at the same times every day and night. This can help you fall asleep faster. Consider making a quiet activity, such as reading, part of your nighttime routine. Try to avoid taking naps during the day so that you sleep better at night. Get out of bed if you are still awake after  15 minutes of trying to sleep. Keep the lights down, but try reading or doing a quiet activity. When you feel  sleepy, go back to bed. General instructions Take over-the-counter and prescription medicines only as told by your health care provider. Exercise regularly as told by your health care provider. However, avoid exercising in the hours right before bedtime. Use relaxation techniques to manage stress. Ask your health care provider to suggest some techniques that may work well for you. These may include: Breathing exercises. Routines to release muscle tension. Visualizing peaceful scenes. Make sure that you drive carefully. Do not drive if you feel very sleepy. Keep all follow-up visits. This is important. Contact a health care provider if: You are tired throughout the day. You have trouble in your daily routine due to sleepiness. You continue to have sleep problems, or your sleep problems get worse. Get help right away if: You have thoughts about hurting yourself or someone else. Get help right away if you feel like you may hurt yourself or others, or have thoughts about taking your own life. Go to your nearest emergency room or: Call 911. Call the National Suicide Prevention Lifeline at (906)021-1611 or 988. This is open 24 hours a day. Text the Crisis Text Line at 325-069-2793. Summary Insomnia is a sleep disorder that makes it difficult to fall asleep or stay asleep. Insomnia can be long-term (chronic) or short-term (acute). Treatment for insomnia depends on the cause. Treatment may focus on treating an underlying condition that is causing the insomnia. Keep a sleep diary to help you and your health care provider figure out what could be causing your insomnia. This information is not intended to replace advice given to you by your health care provider. Make sure you discuss any questions you have with your health care provider. Document Revised: 03/29/2021 Document Reviewed: 03/29/2021 Elsevier Patient Education  2024 [employer].

## 2023-09-22 ENCOUNTER — Ambulatory Visit: Admitting: Nurse Practitioner

## 2023-09-22 ENCOUNTER — Encounter: Payer: Self-pay | Admitting: Nurse Practitioner

## 2023-09-22 VITALS — BP 107/67 | HR 73 | Temp 97.8°F | Ht 72.0 in | Wt 211.4 lb

## 2023-09-22 DIAGNOSIS — N50812 Left testicular pain: Secondary | ICD-10-CM

## 2023-09-22 DIAGNOSIS — F5101 Primary insomnia: Secondary | ICD-10-CM | POA: Diagnosis not present

## 2023-09-22 MED ORDER — GABAPENTIN 600 MG PO TABS: 600.0000 mg | ORAL_TABLET | Freq: Every day | ORAL | 1 refills | Status: DC

## 2023-09-22 NOTE — Progress Notes (Signed)
 BP 107/67   Pulse 73   Temp 97.8 F (36.6 C) (Oral)   Ht 6' (1.829 m)   Wt 211 lb 6.4 oz (95.9 kg)   SpO2 96%   BMI 28.67 kg/m    Subjective:    Patient ID: Thomas Sawyer, male    DOB: [birth year], 38 y.o.   MRN: [id number]  HPI: Thomas Sawyer is a 38 y.o. male  Chief Complaint  Patient presents with   Insomnia   INSOMNIA Follow-up today for insomnia. Taking Gabapentin  300 MG, this has been offering some benefit but not 100%.  Trazodone  in past caused congestion and Melatonin did not work.  Has some underlying RLS which Gabapentin  has helped.  Takes Gabapentin  and Lamictal  at separate times.  This week has been an off week due to work.    Would like new order for scrotum ultrasound as missed one in November for his left testicle pain post vasectomy.  Duration: chronic Satisfied with sleep quality: yes Difficulty falling asleep: no Difficulty staying asleep: yes Waking a few hours after sleep onset: yes -- past few nights has woke-up 30 minutes after sleeping, then does not get back to sleep until 10:30 pm Early morning awakenings: yes Daytime hypersomnolence: no Wakes feeling refreshed: no Good sleep hygiene: yes Apnea: no Snoring: no Depressed/anxious mood: yes Recent stress: yes -- lots of these Restless legs/nocturnal leg cramps: yes Chronic pain/arthritis: no History of sleep study: no Treatments attempted: Trazodone  and melatonin      05/12/2023   12:24 PM 03/03/2023    3:06 PM 02/18/2022    2:02 PM 02/12/2021    2:02 PM 02/07/2020    1:16 PM  Depression screen PHQ 2/9  Decreased Interest 1 1 0 0 0  Down, Depressed, Hopeless 1 0 2 0 0  PHQ - 2 Score 2 1 2  0 0  Altered sleeping 2 2 3  0   Tired, decreased energy 2 1 2  0   Change in appetite 0 0 1 0   Feeling bad or failure about yourself  0 0 2 0   Trouble concentrating 1 1 2  0   Moving slowly or fidgety/restless 1 2 3  0   Suicidal thoughts 0 0 0 0   PHQ-9 Score 8 7 15  0   Difficult doing work/chores  Somewhat difficult  Not difficult at all Not difficult at all        03/03/2023    3:05 PM 02/18/2022    2:02 PM 02/12/2021    2:03 PM 12/28/2018    2:53 PM  GAD 7 : Generalized Anxiety Score  Nervous, Anxious, on Edge 2 2 2  0  Control/stop worrying 1 2 0 0  Worry too much - different things 2 3 0 1  Trouble relaxing 1 2 1 2   Restless 2 2 0 0  Easily annoyed or irritable 3 3 2 1   Afraid - awful might happen 0 0 0 1  Total GAD 7 Score 11 14 5 5   Anxiety Difficulty  Not difficult at all Not difficult at all Not difficult at all   Relevant past medical, surgical, family and social history reviewed and updated as indicated. Interim medical history since our last visit reviewed. Allergies and medications reviewed and updated.  Review of Systems  Constitutional:  Negative for activity change, diaphoresis, fatigue and fever.  Respiratory:  Negative for cough, chest tightness, shortness of breath and wheezing.   Cardiovascular:  Negative for chest pain, palpitations and leg  swelling.  Neurological:  Negative for dizziness, seizures, syncope, weakness, light-headedness, numbness and headaches.  Psychiatric/Behavioral:  Positive for sleep disturbance. Negative for decreased concentration, self-injury and suicidal ideas. The patient is nervous/anxious.     Per HPI unless specifically indicated above     Objective:     BP 107/67   Pulse 73   Temp 97.8 F (36.6 C) (Oral)   Ht 6' (1.829 m)   Wt 211 lb 6.4 oz (95.9 kg)   SpO2 96%   BMI 28.67 kg/m   Wt Readings from Last 3 Encounters:  09/22/23 211 lb 6.4 oz (95.9 kg)  08/18/23 220 lb 14.4 oz (100.2 kg)  07/14/23 220 lb 12.8 oz (100.2 kg)    Physical Exam Vitals and nursing note reviewed.  Constitutional:      General: He is awake. He is not in acute distress.    Appearance: He is well-developed and well-groomed. He is not ill-appearing or toxic-appearing.  HENT:     Head: Normocephalic.     Right Ear: Hearing and external ear  normal.     Left Ear: Hearing and external ear normal.  Eyes:     General: Lids are normal.     Extraocular Movements: Extraocular movements intact.     Conjunctiva/sclera: Conjunctivae normal.  Neck:     Thyroid: No thyromegaly.     Vascular: No carotid bruit.  Cardiovascular:     Rate and Rhythm: Normal rate and regular rhythm.     Heart sounds: Normal heart sounds. No murmur heard.    No gallop.  Pulmonary:     Effort: No accessory muscle usage or respiratory distress.     Breath sounds: Normal breath sounds.  Abdominal:     General: Bowel sounds are normal. There is no distension.     Palpations: Abdomen is soft.     Tenderness: There is no abdominal tenderness.  Musculoskeletal:     Cervical back: Full passive range of motion without pain.     Right lower leg: No edema.     Left lower leg: No edema.  Lymphadenopathy:     Cervical: No cervical adenopathy.  Skin:    General: Skin is warm.     Capillary Refill: Capillary refill takes less than 2 seconds.  Neurological:     Mental Status: He is alert and oriented to person, place, and time.     Deep Tendon Reflexes: Reflexes are normal and symmetric.     Reflex Scores:      Brachioradialis reflexes are 2+ on the right side and 2+ on the left side.      Patellar reflexes are 2+ on the right side and 2+ on the left side. Psychiatric:        Attention and Perception: Attention normal.        Mood and Affect: Mood normal.        Speech: Speech normal.        Behavior: Behavior normal. Behavior is cooperative.        Thought Content: Thought content normal.     Results for orders placed or performed during the hospital encounter of 08/18/23  Group A Strep by PCR   Collection Time: 08/18/23 11:29 AM   Specimen: Throat; Sterile Swab  Result Value Ref Range   Group A Strep by PCR NOT DETECTED NOT DETECTED      Assessment & Plan:   Problem List Items Addressed This Visit       Other  Primary insomnia - Primary    Chronic, ongoing issue due to stressors work.  No benefit from Melatonin.  Trazodone  caused congestion and stopped working.  Does report some RLS at night which Gabapentin  has helped.  Will increase to 600 MG to see if can get consistent good sleep pattern, this is helping with RLS.  Discussed with him to take his Lamictal  in morning and Gabapentin  at night to separate medications.  If ongoing difficulty could consider trial of Lunesta.   ?Maintain a regular sleep schedule, particularly a regular wake-up time in the morning ?Try not to force sleep ?Avoid caffeinated beverages after lunch ?Avoid alcohol near bedtime (eg, late afternoon and evening)  ?Avoid smoking or other nicotine intake, particularly during the evening ?Adjust the bedroom environment as needed to decrease stimuli (eg, reduce ambient light, turn off the television or radio) ?Avoid prolonged use of light-emitting screens (laptops, tablets, smartphones, ebooks) before bedtime  ?Resolve concerns or worries before bedtime ?Exercise regularly for at least 20 minutes, preferably more than four to five hours prior to bedtime  ?Avoid daytime naps, especially if they are longer than 20 to 30 minutes or occur late in the day       Pain in left testicle   Ongoing since vasectomy in May 2024.  ?scar tissue vs hernia.  Will obtain imaging, had ordered in past but missed obtaining.  Recommend he wear supportive underwear and try to avoid frequent heavy lifting until after imaging returns.        Relevant Orders   US  SCROTUM W/DOPPLER     Follow up plan: Return in about 6 weeks (around 11/03/2023) for Insomnia -- increased Gabapentin  to 600 MG.

## 2023-09-22 NOTE — Assessment & Plan Note (Signed)
 Chronic, ongoing issue due to stressors work.  No benefit from Melatonin.  Trazodone  caused congestion and stopped working.  Does report some RLS at night which Gabapentin  has helped.  Will increase to 600 MG to see if can get consistent good sleep pattern, this is helping with RLS.  Discussed with him to take his Lamictal  in morning and Gabapentin  at night to separate medications.  If ongoing difficulty could consider trial of Lunesta.   ?Maintain a regular sleep schedule, particularly a regular wake-up time in the morning ?Try not to force sleep ?Avoid caffeinated beverages after lunch ?Avoid alcohol near bedtime (eg, late afternoon and evening)  ?Avoid smoking or other nicotine intake, particularly during the evening ?Adjust the bedroom environment as needed to decrease stimuli (eg, reduce ambient light, turn off the television or radio) ?Avoid prolonged use of light-emitting screens (laptops, tablets, smartphones, ebooks) before bedtime  ?Resolve concerns or worries before bedtime ?Exercise regularly for at least 20 minutes, preferably more than four to five hours prior to bedtime  ?Avoid daytime naps, especially if they are longer than 20 to 30 minutes or occur late in the day

## 2023-09-22 NOTE — Assessment & Plan Note (Signed)
 Ongoing since vasectomy in May 2024.  ?scar tissue vs hernia.  Will obtain imaging, had ordered in past but missed obtaining.  Recommend he wear supportive underwear and try to avoid frequent heavy lifting until after imaging returns.

## 2023-10-06 ENCOUNTER — Ambulatory Visit
Admission: RE | Admit: 2023-10-06 | Discharge: 2023-10-06 | Disposition: A | Discharge status: Home / Self Care | Source: Ambulatory Visit | Attending: Nurse Practitioner | Admitting: Nurse Practitioner

## 2023-10-06 DIAGNOSIS — N50812 Left testicular pain: Secondary | ICD-10-CM | POA: Diagnosis present

## 2023-10-13 ENCOUNTER — Ambulatory Visit: Payer: Self-pay | Admitting: Nurse Practitioner

## 2023-10-13 NOTE — Progress Notes (Signed)
 Contacted via MyChart  Good afternoon Thomas Sawyer, your imaging has returned and is overall stable with exception of one nonspecific small area of denser tissue to area you are having discomfort too, often this is a benign finding.  However, let me know if you are still having pain and we can get you into urology for further assessment.:)

## 2023-11-04 NOTE — Patient Instructions (Signed)
 Insomnia Insomnia is a sleep disorder that makes it difficult to fall asleep or stay asleep. Insomnia can cause fatigue, low energy, difficulty concentrating, mood swings, and poor performance at work or school. There are three different ways to classify insomnia: Difficulty falling asleep. Difficulty staying asleep. Waking up too early in the morning. Any type of insomnia can be long-term (chronic) or short-term (acute). Both are common. Short-term insomnia usually lasts for 3 months or less. Chronic insomnia occurs at least three times a week for longer than 3 months. What are the causes? Insomnia may be caused by another condition, situation, or substance, such as: Having certain mental health conditions, such as anxiety and depression. Using caffeine, alcohol, tobacco, or drugs. Having gastrointestinal conditions, such as gastroesophageal reflux disease (GERD). Having certain medical conditions. These include: Asthma. Alzheimer's disease. Stroke. Chronic pain. An overactive thyroid gland (hyperthyroidism). Other sleep disorders, such as restless legs syndrome and sleep apnea. Menopause. Sometimes, the cause of insomnia may not be known. What increases the risk? Risk factors for insomnia include: Gender. Females are affected more often than males. Age. Insomnia is more common as people get older. Stress and certain medical and mental health conditions. Lack of exercise. Having an irregular work schedule. This may include working night shifts and traveling between different time zones. What are the signs or symptoms? If you have insomnia, the main symptom is having trouble falling asleep or having trouble staying asleep. This may lead to other symptoms, such as: Feeling tired or having low energy. Feeling nervous about going to sleep. Not feeling rested in the morning. Having trouble concentrating. Feeling irritable, anxious, or depressed. How is this diagnosed? This condition  may be diagnosed based on: Your symptoms and medical history. Your health care provider may ask about: Your sleep habits. Any medical conditions you have. Your mental health. A physical exam. How is this treated? Treatment for insomnia depends on the cause. Treatment may focus on treating an underlying condition that is causing the insomnia. Treatment may also include: Medicines to help you sleep. Counseling or therapy. Lifestyle adjustments to help you sleep better. Follow these instructions at home: Eating and drinking  Limit or avoid alcohol, caffeinated beverages, and products that contain nicotine and tobacco, especially close to bedtime. These can disrupt your sleep. Do not eat a large meal or eat spicy foods right before bedtime. This can lead to digestive discomfort that can make it hard for you to sleep. Sleep habits  Keep a sleep diary to help you and your health care provider figure out what could be causing your insomnia. Write down: When you sleep. When you wake up during the night. How well you sleep and how rested you feel the next day. Any side effects of medicines you are taking. What you eat and drink. Make your bedroom a dark, comfortable place where it is easy to fall asleep. Put up shades or blackout curtains to block light from outside. Use a white noise machine to block noise. Keep the temperature cool. Limit screen use before bedtime. This includes: Not watching TV. Not using your smartphone, tablet, or computer. Stick to a routine that includes going to bed and waking up at the same times every day and night. This can help you fall asleep faster. Consider making a quiet activity, such as reading, part of your nighttime routine. Try to avoid taking naps during the day so that you sleep better at night. Get out of bed if you are still awake after  15 minutes of trying to sleep. Keep the lights down, but try reading or doing a quiet activity. When you feel  sleepy, go back to bed. General instructions Take over-the-counter and prescription medicines only as told by your health care provider. Exercise regularly as told by your health care provider. However, avoid exercising in the hours right before bedtime. Use relaxation techniques to manage stress. Ask your health care provider to suggest some techniques that may work well for you. These may include: Breathing exercises. Routines to release muscle tension. Visualizing peaceful scenes. Make sure that you drive carefully. Do not drive if you feel very sleepy. Keep all follow-up visits. This is important. Contact a health care provider if: You are tired throughout the day. You have trouble in your daily routine due to sleepiness. You continue to have sleep problems, or your sleep problems get worse. Get help right away if: You have thoughts about hurting yourself or someone else. Get help right away if you feel like you may hurt yourself or others, or have thoughts about taking your own life. Go to your nearest emergency room or: Call 911. Call the National Suicide Prevention Lifeline at (906)021-1611 or 988. This is open 24 hours a day. Text the Crisis Text Line at 325-069-2793. Summary Insomnia is a sleep disorder that makes it difficult to fall asleep or stay asleep. Insomnia can be long-term (chronic) or short-term (acute). Treatment for insomnia depends on the cause. Treatment may focus on treating an underlying condition that is causing the insomnia. Keep a sleep diary to help you and your health care provider figure out what could be causing your insomnia. This information is not intended to replace advice given to you by your health care provider. Make sure you discuss any questions you have with your health care provider. Document Revised: 03/29/2021 Document Reviewed: 03/29/2021 Elsevier Patient Education  2024 [employer].

## 2023-11-10 ENCOUNTER — Encounter: Payer: Self-pay | Admitting: Nurse Practitioner

## 2023-11-10 ENCOUNTER — Ambulatory Visit: Admitting: Nurse Practitioner

## 2023-11-10 VITALS — BP 108/67 | HR 71 | Temp 98.2°F | Ht 72.0 in | Wt 209.6 lb

## 2023-11-10 DIAGNOSIS — F5101 Primary insomnia: Secondary | ICD-10-CM

## 2023-11-10 MED ORDER — ZALEPLON 5 MG PO CAPS: 5.0000 mg | ORAL_CAPSULE | Freq: Every evening | ORAL | 2 refills | Status: DC | PRN

## 2023-11-10 NOTE — Progress Notes (Signed)
 BP 108/67   Pulse 71   Temp 98.2 F (36.8 C) (Oral)   Ht 6' (1.829 m)   Wt 209 lb 9.6 oz (95.1 kg)   SpO2 98%   BMI 28.43 kg/m    Subjective:    Patient ID: Thomas Sawyer, male    DOB: May 24, 1985, 38 y.o.   MRN: [id number]  HPI: Thomas Sawyer is a 38 y.o. male  Chief Complaint  Patient presents with   Insomnia    Patient states he has still not been sleeping well.    INSOMNIA Follow-up today for insomnia. Increased Gabapentin  to 600 MG, takes 300 MG during the week and 600 MG on the weekend.  Trazodone  in past caused congestion and Melatonin did not work.  Has some underlying RLS which Gabapentin  did help for a period.  Saw neurology yesterday who recommended a trial of Sonata  in tandem with Gabapentin  on review of note.  Always on the go.  When he wants to sleep he cannot sleep.   Duration: chronic Satisfied with sleep quality: no Difficulty falling asleep: yes Difficulty staying asleep: at times Waking a few hours after sleep onset: yes, this has improved though Early morning awakenings: yes Daytime hypersomnolence: no Wakes feeling refreshed: no Good sleep hygiene: yes Apnea: no Snoring: no Depressed/anxious mood: yes Recent stress: yes -- brother committed suicide recently, was age 66 Restless legs/nocturnal leg cramps: yes Chronic pain/arthritis: no History of sleep study: no Treatments attempted: Trazodone  and melatonin      05/12/2023   12:24 PM 03/03/2023    3:06 PM 02/18/2022    2:02 PM 02/12/2021    2:02 PM 02/07/2020    1:16 PM  Depression screen PHQ 2/9  Decreased Interest 1 1 0 0 0  Down, Depressed, Hopeless 1 0 2 0 0  PHQ - 2 Score 2 1 2  0 0  Altered sleeping 2 2 3  0   Tired, decreased energy 2 1 2  0   Change in appetite 0 0 1 0   Feeling bad or failure about yourself  0 0 2 0   Trouble concentrating 1 1 2  0   Moving slowly or fidgety/restless 1 2 3  0   Suicidal thoughts 0 0 0 0   PHQ-9 Score 8 7 15  0   Difficult doing work/chores Somewhat  difficult  Not difficult at all Not difficult at all        03/03/2023    3:05 PM 02/18/2022    2:02 PM 02/12/2021    2:03 PM 12/28/2018    2:53 PM  GAD 7 : Generalized Anxiety Score  Nervous, Anxious, on Edge 2 2 2  0  Control/stop worrying 1 2 0 0  Worry too much - different things 2 3 0 1  Trouble relaxing 1 2 1 2   Restless 2 2 0 0  Easily annoyed or irritable 3 3 2 1   Afraid - awful might happen 0 0 0 1  Total GAD 7 Score 11 14 5 5   Anxiety Difficulty  Not difficult at all Not difficult at all Not difficult at all   Relevant past medical, surgical, family and social history reviewed and updated as indicated. Interim medical history since our last visit reviewed. Allergies and medications reviewed and updated.  Review of Systems  Constitutional:  Negative for activity change, diaphoresis, fatigue and fever.  Respiratory:  Negative for cough, chest tightness, shortness of breath and wheezing.   Cardiovascular:  Negative for chest pain, palpitations and  leg swelling.  Neurological:  Negative for dizziness, seizures, syncope, weakness, light-headedness, numbness and headaches.  Psychiatric/Behavioral:  Positive for sleep disturbance. Negative for decreased concentration, self-injury and suicidal ideas. The patient is nervous/anxious.     Per HPI unless specifically indicated above     Objective:     BP 108/67   Pulse 71   Temp 98.2 F (36.8 C) (Oral)   Ht 6' (1.829 m)   Wt 209 lb 9.6 oz (95.1 kg)   SpO2 98%   BMI 28.43 kg/m   Wt Readings from Last 3 Encounters:  11/10/23 209 lb 9.6 oz (95.1 kg)  09/22/23 211 lb 6.4 oz (95.9 kg)  08/18/23 220 lb 14.4 oz (100.2 kg)    Physical Exam Vitals and nursing note reviewed.  Constitutional:      General: He is awake. He is not in acute distress.    Appearance: He is well-developed and well-groomed. He is not ill-appearing or toxic-appearing.  HENT:     Head: Normocephalic.     Right Ear: Hearing and external ear normal.      Left Ear: Hearing and external ear normal.  Eyes:     General: Lids are normal.     Extraocular Movements: Extraocular movements intact.     Conjunctiva/sclera: Conjunctivae normal.  Neck:     Thyroid: No thyromegaly.     Vascular: No carotid bruit.  Cardiovascular:     Rate and Rhythm: Normal rate and regular rhythm.     Heart sounds: Normal heart sounds. No murmur heard.    No gallop.  Pulmonary:     Effort: No accessory muscle usage or respiratory distress.     Breath sounds: Normal breath sounds.  Abdominal:     General: Bowel sounds are normal. There is no distension.     Palpations: Abdomen is soft.     Tenderness: There is no abdominal tenderness.  Musculoskeletal:     Cervical back: Full passive range of motion without pain.     Right lower leg: No edema.     Left lower leg: No edema.  Lymphadenopathy:     Cervical: No cervical adenopathy.  Skin:    General: Skin is warm.     Capillary Refill: Capillary refill takes less than 2 seconds.  Neurological:     Mental Status: He is alert and oriented to person, place, and time.     Deep Tendon Reflexes: Reflexes are normal and symmetric.     Reflex Scores:      Brachioradialis reflexes are 2+ on the right side and 2+ on the left side.      Patellar reflexes are 2+ on the right side and 2+ on the left side. Psychiatric:        Attention and Perception: Attention normal.        Mood and Affect: Mood normal.        Speech: Speech normal.        Behavior: Behavior normal. Behavior is cooperative.        Thought Content: Thought content normal.     Results for orders placed or performed during the hospital encounter of 08/18/23  Group A Strep by PCR   Collection Time: 08/18/23 11:29 AM   Specimen: Throat; Sterile Swab  Result Value Ref Range   Group A Strep by PCR NOT DETECTED NOT DETECTED      Assessment & Plan:   Problem List Items Addressed This Visit       Other  Primary insomnia - Primary   Chronic,  ongoing issue due to stressors work.  No benefit from Melatonin.  Trazodone  caused congestion and stopped working.  Gabapentin  has helped his RLS.  Will continue this 300 to 600 MG at night.  Will start Sonata  5 MG at bedtime PRN to take in tandem with this, per neurology recommendation.  Educatted on this medication and side effects to report to PCP.  Discussed with him to take his Lamictal  in morning and Gabapentin  at night to separate medications.  ?Maintain a regular sleep schedule, particularly a regular wake-up time in the morning ?Try not to force sleep ?Avoid caffeinated beverages after lunch ?Avoid alcohol near bedtime (eg, late afternoon and evening)  ?Avoid smoking or other nicotine intake, particularly during the evening ?Adjust the bedroom environment as needed to decrease stimuli (eg, reduce ambient light, turn off the television or radio) ?Avoid prolonged use of light-emitting screens (laptops, tablets, smartphones, ebooks) before bedtime  ?Resolve concerns or worries before bedtime ?Exercise regularly for at least 20 minutes, preferably more than four to five hours prior to bedtime  ?Avoid daytime naps, especially if they are longer than 20 to 30 minutes or occur late in the day          Follow up plan: Return in about 4 weeks (around 12/08/2023) for INSOMNIA.

## 2023-11-10 NOTE — Assessment & Plan Note (Signed)
 Chronic, ongoing issue due to stressors work.  No benefit from Melatonin.  Trazodone  caused congestion and stopped working.  Gabapentin  has helped his RLS.  Will continue this 300 to 600 MG at night.  Will start Sonata  5 MG at bedtime PRN to take in tandem with this, per neurology recommendation.  Educatted on this medication and side effects to report to PCP.  Discussed with him to take his Lamictal  in morning and Gabapentin  at night to separate medications.  ?Maintain a regular sleep schedule, particularly a regular wake-up time in the morning ?Try not to force sleep ?Avoid caffeinated beverages after lunch ?Avoid alcohol near bedtime (eg, late afternoon and evening)  ?Avoid smoking or other nicotine intake, particularly during the evening ?Adjust the bedroom environment as needed to decrease stimuli (eg, reduce ambient light, turn off the television or radio) ?Avoid prolonged use of light-emitting screens (laptops, tablets, smartphones, ebooks) before bedtime  ?Resolve concerns or worries before bedtime ?Exercise regularly for at least 20 minutes, preferably more than four to five hours prior to bedtime  ?Avoid daytime naps, especially if they are longer than 20 to 30 minutes or occur late in the day

## 2023-12-17 NOTE — Patient Instructions (Incomplete)
 Be Involved in Caring For Your Health:  Taking Medications When medications are taken as directed, they can greatly improve your health. But if they are not taken as prescribed, they may not work. In some cases, not taking them correctly can be harmful. To help ensure your treatment remains effective and safe, understand your medications and how to take them. Bring your medications to each visit for review by your provider.  Your lab results, notes, and after visit summary will be available on My Chart. We strongly encourage you to use this feature. If lab results are abnormal the clinic will contact you with the appropriate steps. If the clinic does not contact you assume the results are satisfactory. You can always view your results on My Chart. If you have questions regarding your health or results, please contact the clinic during office hours. You can also ask questions on My Chart.  We at [facility] are grateful that you chose [place] to provide your care. We strive to provide evidence-based and compassionate care and are always looking for feedback. If you get a survey from the clinic please complete this so we can hear your opinions.  Insomnia Insomnia is a sleep disorder that makes it difficult to fall asleep or stay asleep. Insomnia can cause fatigue, low energy, difficulty concentrating, mood swings, and poor performance at work or school. There are three different ways to classify insomnia: Difficulty falling asleep. Difficulty staying asleep. Waking up too early in the morning. Any type of insomnia can be long-term (chronic) or short-term (acute). Both are common. Short-term insomnia usually lasts for 3 months or less. Chronic insomnia occurs at least three times a week for longer than 3 months. What are the causes? Insomnia may be caused by another condition, situation, or substance, such as: Having certain mental health conditions, such as anxiety and depression. Using  caffeine, alcohol, tobacco, or drugs. Having gastrointestinal conditions, such as gastroesophageal reflux disease (GERD). Having certain medical conditions. These include: Asthma. Alzheimer's disease. Stroke. Chronic pain. An overactive thyroid gland (hyperthyroidism). Other sleep disorders, such as restless legs syndrome and sleep apnea. Menopause. Sometimes, the cause of insomnia may not be known. What increases the risk? Risk factors for insomnia include: Gender. Females are affected more often than males. Age. Insomnia is more common as people get older. Stress and certain medical and mental health conditions. Lack of exercise. Having an irregular work schedule. This may include working night shifts and traveling between different time zones. What are the signs or symptoms? If you have insomnia, the main symptom is having trouble falling asleep or having trouble staying asleep. This may lead to other symptoms, such as: Feeling tired or having low energy. Feeling nervous about going to sleep. Not feeling rested in the morning. Having trouble concentrating. Feeling irritable, anxious, or depressed. How is this diagnosed? This condition may be diagnosed based on: Your symptoms and medical history. Your health care provider may ask about: Your sleep habits. Any medical conditions you have. Your mental health. A physical exam. How is this treated? Treatment for insomnia depends on the cause. Treatment may focus on treating an underlying condition that is causing the insomnia. Treatment may also include: Medicines to help you sleep. Counseling or therapy. Lifestyle adjustments to help you sleep better. Follow these instructions at home: Eating and drinking  Limit or avoid alcohol, caffeinated beverages, and products that contain nicotine and tobacco, especially close to bedtime. These can disrupt your sleep. Do not eat a large  meal or eat spicy foods right before bedtime. This  can lead to digestive discomfort that can make it hard for you to sleep. Sleep habits  Keep a sleep diary to help you and your health care provider figure out what could be causing your insomnia. Write down: When you sleep. When you wake up during the night. How well you sleep and how rested you feel the next day. Any side effects of medicines you are taking. What you eat and drink. Make your bedroom a dark, comfortable place where it is easy to fall asleep. Put up shades or blackout curtains to block light from outside. Use a white noise machine to block noise. Keep the temperature cool. Limit screen use before bedtime. This includes: Not watching TV. Not using your smartphone, tablet, or computer. Stick to a routine that includes going to bed and waking up at the same times every day and night. This can help you fall asleep faster. Consider making a quiet activity, such as reading, part of your nighttime routine. Try to avoid taking naps during the day so that you sleep better at night. Get out of bed if you are still awake after 15 minutes of trying to sleep. Keep the lights down, but try reading or doing a quiet activity. When you feel sleepy, go back to bed. General instructions Take over-the-counter and prescription medicines only as told by your health care provider. Exercise regularly as told by your health care provider. However, avoid exercising in the hours right before bedtime. Use relaxation techniques to manage stress. Ask your health care provider to suggest some techniques that may work well for you. These may include: Breathing exercises. Routines to release muscle tension. Visualizing peaceful scenes. Make sure that you drive carefully. Do not drive if you feel very sleepy. Keep all follow-up visits. This is important. Contact a health care provider if: You are tired throughout the day. You have trouble in your daily routine due to sleepiness. You continue to have  sleep problems, or your sleep problems get worse. Get help right away if: You have thoughts about hurting yourself or someone else. Get help right away if you feel like you may hurt yourself or others, or have thoughts about taking your own life. Go to your nearest emergency room or: Call 911. Call the National Suicide Prevention Lifeline at 512-196-3132 or 988. This is open 24 hours a day. Text the Crisis Text Line at 825-066-1380. Summary Insomnia is a sleep disorder that makes it difficult to fall asleep or stay asleep. Insomnia can be long-term (chronic) or short-term (acute). Treatment for insomnia depends on the cause. Treatment may focus on treating an underlying condition that is causing the insomnia. Keep a sleep diary to help you and your health care provider figure out what could be causing your insomnia. This information is not intended to replace advice given to you by your health care provider. Make sure you discuss any questions you have with your health care provider. Document Revised: 03/29/2021 Document Reviewed: 03/29/2021 Elsevier Patient Education  2024 [employer].

## 2023-12-21 ENCOUNTER — Telehealth: Payer: Self-pay

## 2023-12-21 NOTE — Telephone Encounter (Signed)
 Noted and this is okay

## 2023-12-21 NOTE — Telephone Encounter (Signed)
 Copied from CRM #[id number]. Topic: General - Other >> Dec 21, 2023  2:12 PM Fonda T wrote: Reason for CRM: Received call from patient, had to cancel upcoming appointment on 12/22/23 due to court, reason for visit was to follow up for insomnia.  Patient would like to report he is doing well on prescribed sleep medication, and states he has a visit for physical scheduled on 03/08/24, which he would like to keep and discuss at that time.   Sending CRM as FYI to office.

## 2023-12-22 ENCOUNTER — Ambulatory Visit: Admitting: Nurse Practitioner

## 2024-02-21 ENCOUNTER — Other Ambulatory Visit: Payer: Self-pay | Admitting: Nurse Practitioner

## 2024-02-23 NOTE — Telephone Encounter (Signed)
 Requested Prescriptions  Pending Prescriptions Disp Refills   valACYclovir  (VALTREX ) 1000 MG tablet [Pharmacy Med Name: valACYclovir  HCl 1 GM Oral Tablet] 20 tablet 2    Sig: Take 1 tablet by mouth twice daily as needed     Antimicrobials:  Antiviral Agents - Anti-Herpetic Passed - 02/23/2024 11:15 AM      Passed - Valid encounter within last 12 months    Recent Outpatient Visits           3 months ago Primary insomnia   [facility] [place], [place] T, NP   5 months ago Primary insomnia   [facility] [place], [place] T, NP   7 months ago Primary insomnia   [facility] [place], Melanie DASEN, NP

## 2024-03-02 NOTE — Patient Instructions (Signed)
 Be Involved in Caring For Your Health:  Taking Medications When medications are taken as directed, they can greatly improve your health. But if they are not taken as prescribed, they may not work. In some cases, not taking them correctly can be harmful. To help ensure your treatment remains effective and safe, understand your medications and how to take them. Bring your medications to each visit for review by your provider.  Your lab results, notes, and after visit summary will be available on My Chart. We strongly encourage you to use this feature. If lab results are abnormal the clinic will contact you with the appropriate steps. If the clinic does not contact you assume the results are satisfactory. You can always view your results on My Chart. If you have questions regarding your health or results, please contact the clinic during office hours. You can also ask questions on My Chart.  We at [facility] are grateful that you chose us  to provide your care. We strive to provide evidence-based and compassionate care and are always looking for feedback. If you get a survey from the clinic please complete this so we can hear your opinions.  Healthy Eating, Adult Healthy eating may help you get and keep a healthy body weight, reduce the risk of chronic disease, and live a long and productive life. It is important to follow a healthy eating pattern. Your nutritional and calorie needs should be met mainly by different nutrient-rich foods. What are tips for following this plan? Reading food labels Read labels and choose the following: Reduced or low sodium products. Juices with 100% fruit juice. Foods with low saturated fats (<3 g per serving) and high polyunsaturated and monounsaturated fats. Foods with whole grains, such as whole wheat, cracked wheat, brown rice, and wild rice. Whole grains that are fortified with folic acid. This is recommended for females who are pregnant or who want to  become pregnant. Read labels and do not eat or drink the following: Foods or drinks with added sugars. These include foods that contain brown sugar, corn sweetener, corn syrup, dextrose , fructose, glucose, high-fructose corn syrup, honey, invert sugar, lactose, malt syrup, maltose, molasses, raw sugar, sucrose, trehalose, or turbinado sugar. Limit your intake of added sugars to less than 10% of your total daily calories. Do not eat more than the following amounts of added sugar per day: 6 teaspoons (25 g) for females. 9 teaspoons (38 g) for males. Foods that contain processed or refined starches and grains. Refined grain products, such as white flour, degermed cornmeal, white bread, and white rice. Shopping Choose nutrient-rich snacks, such as vegetables, whole fruits, and nuts. Avoid high-calorie and high-sugar snacks, such as potato chips, fruit snacks, and candy. Use oil-based dressings and spreads on foods instead of solid fats such as butter, margarine, sour cream, or cream cheese. Limit pre-made sauces, mixes, and instant products such as flavored rice, instant noodles, and ready-made pasta. Try more plant-protein sources, such as tofu, tempeh, black beans, edamame, lentils, nuts, and seeds. Explore eating plans such as the Mediterranean diet or vegetarian diet. Try heart-healthy dips made with beans and healthy fats like hummus and guacamole. Vegetables go great with these. Cooking Use oil to saut or stir-fry foods instead of solid fats such as butter, margarine, or lard. Try baking, boiling, grilling, or broiling instead of frying. Remove the fatty part of meats before cooking. Steam vegetables in water  or broth. Meal planning  At meals, imagine dividing your plate into fourths: One-half of  your plate is fruits and vegetables. One-fourth of your plate is whole grains. One-fourth of your plate is protein, especially lean meats, poultry, eggs, tofu, beans, or nuts. Include low-fat  dairy as part of your daily diet. Lifestyle Choose healthy options in all settings, including home, work, school, restaurants, or stores. Prepare your food safely: Wash your hands after handling raw meats. Where you prepare food, keep surfaces clean by regularly washing with hot, soapy water . Keep raw meats separate from ready-to-eat foods, such as fruits and vegetables. Cook seafood, meat, poultry, and eggs to the recommended temperature. Get a food thermometer. Store foods at safe temperatures. In general: Keep cold foods at 84F (4.4C) or below. Keep hot foods at 184F (60C) or above. Keep your freezer at [facility] (-17.8C) or below. Foods are not safe to eat if they have been between the temperatures of 40-184F (4.4-60C) for more than 2 hours. What foods should I eat? Fruits Aim to eat 1-2 cups of fresh, canned (in natural juice), or frozen fruits each day. One cup of fruit equals 1 small apple, 1 large banana, 8 large strawberries, 1 cup (237 g) canned fruit,  cup (82 g) dried fruit, or 1 cup (240 mL) 100% juice. Vegetables Aim to eat 2-4 cups of fresh and frozen vegetables each day, including different varieties and colors. One cup of vegetables equals 1 cup (91 g) broccoli or cauliflower florets, 2 medium carrots, 2 cups (150 g) raw, leafy greens, 1 large tomato, 1 large bell pepper, 1 large sweet potato, or 1 medium white potato. Grains Aim to eat 5-10 ounce-equivalents of whole grains each day. Examples of 1 ounce-equivalent of grains include 1 slice of bread, 1 cup (40 g) ready-to-eat cereal, 3 cups (24 g) popcorn, or  cup (93 g) cooked rice. Meats and other proteins Try to eat 5-7 ounce-equivalents of protein each day. Examples of 1 ounce-equivalent of protein include 1 egg,  oz nuts (12 almonds, 24 pistachios, or 7 walnut halves), 1/4 cup (90 g) cooked beans, 6 tablespoons (90 g) hummus or 1 tablespoon (16 g) peanut butter. A cut of meat or fish that is the size of a deck of  cards is about 3-4 ounce-equivalents (85 g). Of the protein you eat each week, try to have at least 8 sounce (227 g) of seafood. This is about 2 servings per week. This includes salmon, trout, herring, sardines, and anchovies. Dairy Aim to eat 3 cup-equivalents of fat-free or low-fat dairy each day. Examples of 1 cup-equivalent of dairy include 1 cup (240 mL) milk, 8 ounces (250 g) yogurt, 1 ounces (44 g) natural cheese, or 1 cup (240 mL) fortified soy milk. Fats and oils Aim for about 5 teaspoons (21 g) of fats and oils per day. Choose monounsaturated fats, such as canola and olive oils, mayonnaise made with olive oil or avocado oil, avocados, peanut butter, and most nuts, or polyunsaturated fats, such as sunflower, corn, and soybean oils, walnuts, pine nuts, sesame seeds, sunflower seeds, and flaxseed. Beverages Aim for 6 eight-ounce glasses of water  per day. Limit coffee to 3-5 eight-ounce cups per day. Limit caffeinated beverages that have added calories, such as soda and energy drinks. If you drink alcohol: Limit how much you have to: 0-1 drink a day if you are male. 0-2 drinks a day if you are male. Know how much alcohol is in your drink. In the [place] one drink is one 12 oz bottle of beer (355 mL), one 5 oz glass of wine (  148 mL), or one 1 oz glass of hard liquor (44 mL). Seasoning and other foods Try not to add too much salt to your food. Try using herbs and spices instead of salt. Try not to add sugar to food. This information is based on [place] nutrition guidelines. To learn more, visit [employer]. Exact amounts may vary. You may need different amounts. This information is not intended to replace advice given to you by your health care provider. Make sure you discuss any questions you have with your health care provider. Document Revised: 01/17/2022 Document Reviewed: 01/17/2022 Elsevier Patient Education  2024 [employer].

## 2024-03-08 ENCOUNTER — Encounter: Payer: Self-pay | Admitting: Nurse Practitioner

## 2024-03-08 ENCOUNTER — Ambulatory Visit (INDEPENDENT_AMBULATORY_CARE_PROVIDER_SITE_OTHER): Admitting: Nurse Practitioner

## 2024-03-08 VITALS — BP 128/80 | HR 89 | Temp 98.2°F | Ht 72.1 in | Wt 218.8 lb

## 2024-03-08 DIAGNOSIS — F1911 Other psychoactive substance abuse, in remission: Secondary | ICD-10-CM | POA: Diagnosis not present

## 2024-03-08 DIAGNOSIS — G2581 Restless legs syndrome: Secondary | ICD-10-CM | POA: Diagnosis not present

## 2024-03-08 DIAGNOSIS — G40909 Epilepsy, unspecified, not intractable, without status epilepticus: Secondary | ICD-10-CM

## 2024-03-08 DIAGNOSIS — F5101 Primary insomnia: Secondary | ICD-10-CM | POA: Diagnosis not present

## 2024-03-08 DIAGNOSIS — Z720 Tobacco use: Secondary | ICD-10-CM

## 2024-03-08 DIAGNOSIS — Z0000 Encounter for general adult medical examination without abnormal findings: Secondary | ICD-10-CM

## 2024-03-08 DIAGNOSIS — E66811 Obesity, class 1: Secondary | ICD-10-CM

## 2024-03-08 DIAGNOSIS — E7800 Pure hypercholesterolemia, unspecified: Secondary | ICD-10-CM

## 2024-03-08 DIAGNOSIS — K219 Gastro-esophageal reflux disease without esophagitis: Secondary | ICD-10-CM

## 2024-03-08 DIAGNOSIS — E559 Vitamin D deficiency, unspecified: Secondary | ICD-10-CM

## 2024-03-08 DIAGNOSIS — F4321 Adjustment disorder with depressed mood: Secondary | ICD-10-CM

## 2024-03-08 MED ORDER — GABAPENTIN 600 MG PO TABS: 600.0000 mg | ORAL_TABLET | Freq: Every day | ORAL | 2 refills | Status: AC

## 2024-03-08 MED ORDER — ZALEPLON 5 MG PO CAPS: 5.0000 mg | ORAL_CAPSULE | Freq: Every evening | ORAL | 2 refills | Status: AC | PRN

## 2024-03-08 NOTE — Assessment & Plan Note (Addendum)
 Sober for >7 years, praised for this and recommend continued cessation.  Support on this journey.

## 2024-03-08 NOTE — Assessment & Plan Note (Signed)
 Noted on labs in 2023, not taking supplement for this.  Recheck today and initiate supplement as needed.

## 2024-03-08 NOTE — Progress Notes (Signed)
 BP 128/80 (BP Location: Left Arm, Patient Position: Sitting)   Pulse 89   Temp 98.2 F (36.8 C) (Oral)   Ht 6' 0.1 (1.831 m)   Wt 218 lb 12.8 oz (99.2 kg)   SpO2 96%   BMI 29.59 kg/m    Subjective:    Patient ID: Thomas Sawyer, male    DOB: Apr 09, 1986, 38 y.o.   MRN: [id number]  HPI: Thomas Sawyer is a 38 y.o. male presenting on 03/08/2024 for comprehensive medical examination. Current medical complaints include: no  He currently lives with: wife and kids Interim Problems from his last visit: none  Currently takes no vitamins at home.  History of low Vitamin D  on labs.  SEIZURE DISORDER: Follows with [facility] neurology in past, last 11/09/23. To continue on medication at this time.  He was diagnosed in August 2023 after an LOC episode. Takes Lamictal  XR 300 MG nightly.  Denies any further seizure episodes or LOC since June 2023. Has no family history of seizures. Past MRI was reassuring. Does have history of a brain tumor removal in 2011.  INSOMNIA Stopped taking Gabapentin  due to concerns he heard from a friend about dementia.  Tried Trazodone  with Sonata  and this caused crazy dreams. Has some underlying RLS which Gabapentin  did help for a period. Saw neurology yesterday who recommended a trial of Sonata  in tandem with Gabapentin  on review of note.  With Sonata  and Gabapentin  was sleeping well. Duration: chronic Satisfied with sleep quality: no Difficulty falling asleep: yes Difficulty staying asleep: yes Waking a few hours after sleep onset: yes Early morning awakenings: yes Daytime hypersomnolence: no Wakes feeling refreshed: no Good sleep hygiene: yes Apnea: no Snoring: no Depressed/anxious mood: yes Recent stress: yes Restless legs/nocturnal leg cramps: yes Chronic pain/arthritis: no History of sleep study: no Treatments attempted: Trazodone , Gabapentin , Sonata , and melatonin      03/08/2024    1:35 PM 05/12/2023   12:24 PM 03/03/2023    3:06 PM 02/18/2022    2:02 PM  02/12/2021    2:02 PM  Depression screen PHQ 2/9  Decreased Interest 1 1 1  0 0  Down, Depressed, Hopeless 0 1 0 2 0  PHQ - 2 Score 1 2 1 2  0  Altered sleeping 3 2 2 3  0  Tired, decreased energy 3 2 1 2  0  Change in appetite 0 0 0 1 0  Feeling bad or failure about yourself  1 0 0 2 0  Trouble concentrating 1 1 1 2  0  Moving slowly or fidgety/restless 0 1 2 3  0  Suicidal thoughts 0 0 0 0 0  PHQ-9 Score 9 8  7  15   0   Difficult doing work/chores Somewhat difficult Somewhat difficult  Not difficult at all Not difficult at all     Data saved with a previous flowsheet row definition      03/03/2023    3:05 PM 02/18/2022    2:02 PM 02/12/2021    2:03 PM 12/28/2018    2:53 PM  GAD 7 : Generalized Anxiety Score  Nervous, Anxious, on Edge 2 2 2  0  Control/stop worrying 1 2 0 0  Worry too much - different things 2 3 0 1  Trouble relaxing 1 2 1 2   Restless 2 2 0 0  Easily annoyed or irritable 3 3 2 1   Afraid - awful might happen 0 0 0 1  Total GAD 7 Score 11 14 5 5   Anxiety Difficulty  Not  difficult at all Not difficult at all Not difficult at all      01/24/2020    4:26 PM 02/07/2020    1:15 PM 02/12/2021    2:15 PM 02/18/2022    1:59 PM 03/08/2024    1:35 PM  Fall Risk  Falls in the past year?  0 0 0 0  Was there an injury with Fall?  0 0 0 0  Fall Risk Category Calculator  0 0 0 0  Fall Risk Category (Retired)  Low  Low  Low    (RETIRED) Patient Fall Risk Level Low fall risk  Low fall risk  Low fall risk  Low fall risk    Patient at Risk for Falls Due to  No Fall Risks No Fall Risks No Fall Risks No Fall Risks  Fall risk Follow up  Falls evaluation completed  Falls prevention discussed  Falls evaluation completed  Falls evaluation completed     Data saved with a previous flowsheet row definition    Functional Status Survey: Is the patient deaf or have difficulty hearing?: No Does the patient have difficulty seeing, even when wearing glasses/contacts?: No Does the patient  have difficulty concentrating, remembering, or making decisions?: No Does the patient have difficulty walking or climbing stairs?: No Does the patient have difficulty dressing or bathing?: No Does the patient have difficulty doing errands alone such as visiting a doctor's office or shopping?: No   Past Medical History:  Past Medical History:  Diagnosis Date   ADHD (attention deficit hyperactivity disorder)    Anxiety    Depression    Depression    Genital warts    Hiatal hernia    Oppositional defiant behavior     Surgical History:  Past Surgical History:  Procedure Laterality Date   BRAIN SURGERY  05/02/2009   tumor removal    Medications:  Current Outpatient Medications on File Prior to Visit  Medication Sig   LamoTRIgine  300 MG TB24 24 hour tablet Take 1 tablet by mouth daily.   valACYclovir  (VALTREX ) 1000 MG tablet Take 1 tablet by mouth twice daily as needed   No current facility-administered medications on file prior to visit.    Allergies:  No Known Allergies  Social History:  Social History   Socioeconomic History   Marital status: Married    Spouse name: Not on file   Number of children: Not on file   Years of education: Not on file   Highest education level: Not on file  Occupational History   Not on file  Tobacco Use   Smoking status: Former    Current packs/day: 0.50    Average packs/day: 0.5 packs/day for 18.0 years (9.0 ttl pk-yrs)    Types: Cigarettes   Smokeless tobacco: Current    Types: Snuff  Vaping Use   Vaping status: Former  Substance and Sexual Activity   Alcohol use: Not Currently    Comment: rare   Drug use: Not Currently    Types: Marijuana   Sexual activity: Not on file  Other Topics Concern   Not on file  Social History Narrative   Not on file   Social Drivers of Health   Financial Resource Strain: Low Risk  (03/08/2024)   Overall Financial Resource Strain (CARDIA)    Difficulty of Paying Living Expenses: Not hard at  all  Food Insecurity: No Food Insecurity (03/08/2024)   Hunger Vital Sign    Worried About Running Out of Food in the Last Year:  Never true    Ran Out of Food in the Last Year: Never true  Transportation Needs: No Transportation Needs (03/08/2024)   PRAPARE - Administrator, Civil Service (Medical): No    Lack of Transportation (Non-Medical): No  Physical Activity: Sufficiently Active (03/08/2024)   Exercise Vital Sign    Days of Exercise per Week: 5 days    Minutes of Exercise per Session: 60 min  Stress: Stress Concern Present (03/08/2024)   [employer] of Occupational Health - Occupational Stress Questionnaire    Feeling of Stress: Rather much  Social Connections: Moderately Isolated (03/08/2024)   Social Connection and Isolation Panel    Frequency of Communication with Friends and Family: Once a week    Frequency of Social Gatherings with Friends and Family: Once a week    Attends Religious Services: More than 4 times per year    Active Member of [employer] or Organizations: No    Attends Banker Meetings: Never    Marital Status: Married  Catering Manager Violence: Not At Risk (03/08/2024)   Humiliation, Afraid, Rape, and Kick questionnaire    Fear of Current or Ex-Partner: No    Emotionally Abused: No    Physically Abused: No    Sexually Abused: No   Social History   Tobacco Use  Smoking Status Former   Current packs/day: 0.50   Average packs/day: 0.5 packs/day for 18.0 years (9.0 ttl pk-yrs)   Types: Cigarettes  Smokeless Tobacco Current   Types: Snuff   Social History   Substance and Sexual Activity  Alcohol Use Not Currently   Comment: rare    Family History:  Family History  Problem Relation Age of Onset   Asthma Father    Diabetes Father    Cancer - Lung Father    Cancer Neg Hx    COPD Neg Hx    Heart disease Neg Hx    Hypertension Neg Hx    Stroke Neg Hx    Prostate cancer Neg Hx    Sickle cell trait Neg Hx    Bladder Cancer  Neg Hx    Kidney cancer Neg Hx     Past medical history, surgical history, medications, allergies, family history and social history reviewed with patient today and changes made to appropriate areas of the chart.   ROS All other ROS negative except what is listed above and in the HPI.      Objective:    BP 128/80 (BP Location: Left Arm, Patient Position: Sitting)   Pulse 89   Temp 98.2 F (36.8 C) (Oral)   Ht 6' 0.1 (1.831 m)   Wt 218 lb 12.8 oz (99.2 kg)   SpO2 96%   BMI 29.59 kg/m   Wt Readings from Last 3 Encounters:  03/08/24 218 lb 12.8 oz (99.2 kg)  11/10/23 209 lb 9.6 oz (95.1 kg)  09/22/23 211 lb 6.4 oz (95.9 kg)   Physical Exam Vitals and nursing note reviewed.  Constitutional:      General: He is awake. He is not in acute distress.    Appearance: He is well-developed and well-groomed. He is not ill-appearing or toxic-appearing.  HENT:     Head: Normocephalic and atraumatic.     Right Ear: Hearing, tympanic membrane, ear canal and external ear normal. No drainage.     Left Ear: Hearing, tympanic membrane, ear canal and external ear normal. No drainage.     Nose: Nose normal.     Mouth/Throat:  Pharynx: Uvula midline.  Eyes:     General: Lids are normal.        Right eye: No discharge.        Left eye: No discharge.     Extraocular Movements: Extraocular movements intact.     Conjunctiva/sclera: Conjunctivae normal.     Pupils: Pupils are equal, round, and reactive to light.     Visual Fields: Right eye visual fields normal and left eye visual fields normal.  Neck:     Thyroid: No thyromegaly.     Vascular: No carotid bruit or JVD.     Trachea: Trachea normal.  Cardiovascular:     Rate and Rhythm: Normal rate and regular rhythm.     Heart sounds: Normal heart sounds, S1 normal and S2 normal. No murmur heard.    No gallop.  Pulmonary:     Effort: Pulmonary effort is normal. No accessory muscle usage or respiratory distress.     Breath sounds: Normal  breath sounds.  Abdominal:     General: Bowel sounds are normal.     Palpations: Abdomen is soft. There is no hepatomegaly or splenomegaly.     Tenderness: There is no abdominal tenderness.  Musculoskeletal:        General: Normal range of motion.     Cervical back: Normal range of motion and neck supple.     Right lower leg: No edema.     Left lower leg: No edema.  Lymphadenopathy:     Head:     Right side of head: No submental, submandibular, tonsillar, preauricular or posterior auricular adenopathy.     Left side of head: No submental, submandibular, tonsillar, preauricular or posterior auricular adenopathy.     Cervical: No cervical adenopathy.  Skin:    General: Skin is warm and dry.     Capillary Refill: Capillary refill takes less than 2 seconds.     Findings: No rash.  Neurological:     Mental Status: He is alert and oriented to person, place, and time.     Gait: Gait is intact.     Deep Tendon Reflexes: Reflexes are normal and symmetric.     Reflex Scores:      Brachioradialis reflexes are 2+ on the right side and 2+ on the left side.      Patellar reflexes are 2+ on the right side and 2+ on the left side. Psychiatric:        Attention and Perception: Attention normal.        Mood and Affect: Mood normal.        Speech: Speech normal.        Behavior: Behavior normal. Behavior is cooperative.        Thought Content: Thought content normal.        Cognition and Memory: Cognition normal.    Results for orders placed or performed during the hospital encounter of 08/18/23  Group A Strep by PCR   Collection Time: 08/18/23 11:29 AM   Specimen: Throat; Sterile Swab  Result Value Ref Range   Group A Strep by PCR NOT DETECTED NOT DETECTED      Assessment & Plan:   Problem List Items Addressed This Visit       Digestive   Gastroesophageal reflux disease   Chronic, ongoing with hiatal hernia in past noted on EGD.  Continue Protonix  daily only as needed.  Risks of daily  PPI use were discussed with patient including bone loss, C. Diff diarrhea, pneumonia,  infections, CKD, electrolyte abnormalities.  Verbalizes understanding and chooses to continue the medication.  Mag level annually      Relevant Orders   Magnesium     Nervous and Auditory   Seizure disorder (HCC) - Primary   Stable.  Diagnosed August 2023 with history of ventricular choroid plexus papilloma resected in 2012.  Currently followed by [facility] neurology, will continue this collaboration and Lamictal  as ordered.  Recent notes reviewed. To return to them in future and remain on Lamictal  at this time with plan for reduction in a few years.  Check level.      Relevant Medications   gabapentin  (NEURONTIN ) 600 MG tablet   zaleplon  (SONATA ) 5 MG capsule   Other Relevant Orders   CBC with Differential/Platelet   TSH   Lamotrigine  level     Other   Vitamin D  deficiency   Noted on labs in 2023, not taking supplement for this.  Recheck today and initiate supplement as needed.      Relevant Orders   VITAMIN D  25 Hydroxy (Vit-D Deficiency, Fractures)   Restless leg syndrome   Stable. Continue Gabapentin  600 MG every night as needed.  Discussed with him to take his Lamictal  in morning and Gabapentin  at night to separate medications.  We discussed at length the research on Gabapentin  and goal to maintain lowest dose for shortest period of time.      Primary insomnia   Chronic, ongoing issue due to stressors work.  No benefit from Melatonin.  Trazodone  caused congestion and stopped working.  Gabapentin  has helped his RLS.  Continue Gabapentin  600 MG at night as needed and Sonata  5 MG at bedtime PRN to take in tandem with this, per neurology recommendation.  Educated on this medication and side effects to report to PCP.  Discussed with him to take his Lamictal  in morning and Gabapentin  at night to separate medications. We discussed at length the research on Gabapentin  and goal to maintain lowest dose for  shortest period of time. ?Maintain a regular sleep schedule, particularly a regular wake-up time in the morning ?Try not to force sleep ?Avoid caffeinated beverages after lunch ?Avoid alcohol near bedtime (eg, late afternoon and evening)  ?Avoid smoking or other nicotine intake, particularly during the evening ?Adjust the bedroom environment as needed to decrease stimuli (eg, reduce ambient light, turn off the television or radio) ?Avoid prolonged use of light-emitting screens (laptops, tablets, smartphones, ebooks) before bedtime  ?Resolve concerns or worries before bedtime ?Exercise regularly for at least 20 minutes, preferably more than four to five hours prior to bedtime  ?Avoid daytime naps, especially if they are longer than 20 to 30 minutes or occur late in the day       Hx of substance abuse (HCC)   Sober for >7 years, praised for this and recommend continued cessation.  Support on this journey.      Elevated low density lipoprotein (LDL) cholesterol level   Noted on past labs, recheck CMP and lipid panel today.  Continue focus on diet and regular activity.      Relevant Orders   Comprehensive metabolic panel with GFR   Lipid Panel w/o Chol/HDL Ratio   Other Visit Diagnoses       Encounter for annual physical exam       Annual physical today with labs and health maintenance reviewed, discussed with patient.       LABORATORY TESTING:  Health maintenance labs ordered today as discussed above.   IMMUNIZATIONS:   -  Tdap: Tetanus vaccination status reviewed: last tetanus booster within 10 years. - Influenza: Refused - Pneumovax: Not applicable - Prevnar: Not applicable - Zostavax vaccine: Not applicable  SCREENING: - Colonoscopy: Not applicable  Discussed with patient purpose of the colonoscopy is to detect colon cancer at curable precancerous or early stages   - AAA Screening: Not applicable  -Hearing Test: Not applicable  -Spirometry: Not applicable   PATIENT  COUNSELING:    Sexuality: Discussed sexually transmitted diseases, partner selection, use of condoms, avoidance of unintended pregnancy  and contraceptive alternatives.   Advised to avoid cigarette smoking.  I discussed with the patient that most people either abstain from alcohol or drink within safe limits (<=14/week and <=4 drinks/occasion for males, <=7/weeks and <= 3 drinks/occasion for females) and that the risk for alcohol disorders and other health effects rises proportionally with the number of drinks per week and how often a drinker exceeds daily limits.  Discussed cessation/primary prevention of drug use and availability of treatment for abuse.   Diet: Encouraged to adjust caloric intake to maintain  or achieve ideal body weight, to reduce intake of dietary saturated fat and total fat, to limit sodium intake by avoiding high sodium foods and not adding table salt, and to maintain adequate dietary potassium and calcium preferably from fresh fruits, vegetables, and low-fat dairy products.    Stressed the importance of regular exercise  Injury prevention: Discussed safety belts, safety helmets, smoke detector, smoking near bedding or upholstery.   Dental health: Discussed importance of regular tooth brushing, flossing, and dental visits.   Follow up plan: NEXT PREVENTATIVE PHYSICAL DUE IN 1 YEAR. Return in about 6 months (around 09/05/2024) for Insomnia.

## 2024-03-08 NOTE — Assessment & Plan Note (Signed)
 Stable.  Diagnosed August 2023 with history of ventricular choroid plexus papilloma resected in 2012.  Currently followed by [facility] neurology, will continue this collaboration and Lamictal as ordered.  Recent notes reviewed. To return to them in future and remain on Lamictal at this time with plan for reduction in a few years.  Check level.

## 2024-03-08 NOTE — Assessment & Plan Note (Signed)
 Stable. Continue Gabapentin  600 MG every night as needed.  Discussed with him to take his Lamictal  in morning and Gabapentin  at night to separate medications.  We discussed at length the research on Gabapentin  and goal to maintain lowest dose for shortest period of time.

## 2024-03-08 NOTE — Assessment & Plan Note (Signed)
 Chronic, ongoing issue due to stressors work.  No benefit from Melatonin.  Trazodone  caused congestion and stopped working.  Gabapentin  has helped his RLS.  Continue Gabapentin  600 MG at night as needed and Sonata  5 MG at bedtime PRN to take in tandem with this, per neurology recommendation.  Educated on this medication and side effects to report to PCP.  Discussed with him to take his Lamictal  in morning and Gabapentin  at night to separate medications. We discussed at length the research on Gabapentin  and goal to maintain lowest dose for shortest period of time. ?Maintain a regular sleep schedule, particularly a regular wake-up time in the morning ?Try not to force sleep ?Avoid caffeinated beverages after lunch ?Avoid alcohol near bedtime (eg, late afternoon and evening)  ?Avoid smoking or other nicotine intake, particularly during the evening ?Adjust the bedroom environment as needed to decrease stimuli (eg, reduce ambient light, turn off the television or radio) ?Avoid prolonged use of light-emitting screens (laptops, tablets, smartphones, ebooks) before bedtime  ?Resolve concerns or worries before bedtime ?Exercise regularly for at least 20 minutes, preferably more than four to five hours prior to bedtime  ?Avoid daytime naps, especially if they are longer than 20 to 30 minutes or occur late in the day

## 2024-03-08 NOTE — Assessment & Plan Note (Signed)
Noted on past labs, recheck CMP and lipid panel today.  Continue focus on diet and regular activity.

## 2024-03-08 NOTE — Assessment & Plan Note (Signed)
 Chronic, ongoing with hiatal hernia in past noted on EGD.  Continue Protonix daily only as needed.  Risks of daily PPI use were discussed with patient including bone loss, C. Diff diarrhea, pneumonia, infections, CKD, electrolyte abnormalities.  Verbalizes understanding and chooses to continue the medication.  Mag level annually

## 2024-03-09 ENCOUNTER — Ambulatory Visit: Payer: Self-pay | Admitting: Nurse Practitioner

## 2024-03-09 DIAGNOSIS — E7800 Pure hypercholesterolemia, unspecified: Secondary | ICD-10-CM

## 2024-03-09 DIAGNOSIS — R7989 Other specified abnormal findings of blood chemistry: Secondary | ICD-10-CM

## 2024-03-09 NOTE — Progress Notes (Signed)
 Contacted via MyChart -- needs 4 week lab only visit please  Good morning Thomas Sawyer, your labs have returned: - Kidney function, creatinine and eGFR, remains stable, as is liver function, AST and ALT.  - Lipid panel remains elevated, I recommend we recheck this outpatient in 4 weeks with you fasting to better assess this. - Thyroid, TSH, shows level a little low. We will recheck this in 4 weeks too. - Vitamin D  remains low, recommend taking Vitamin D  2000 units daily. - Remainder of labs stable. Waiting on Lamictal  level.  Any questions? Keep being amazing!!  Thank you for allowing me to participate in your care.  I appreciate you. Kindest regards, Alexxa Sabet

## 2024-03-10 LAB — CBC WITH DIFFERENTIAL/PLATELET
Basophils Absolute: 0 x10E3/uL (ref 0.0–0.2)
Basos: 1 %
EOS (ABSOLUTE): 0.1 x10E3/uL (ref 0.0–0.4)
Eos: 1 %
Hematocrit: 41.3 % (ref 37.5–51.0)
Hemoglobin: 14.4 g/dL (ref 13.0–17.7)
Immature Grans (Abs): 0 x10E3/uL (ref 0.0–0.1)
Immature Granulocytes: 0 %
Lymphocytes Absolute: 1.9 x10E3/uL (ref 0.7–3.1)
Lymphs: 26 %
MCH: 32.4 pg (ref 26.6–33.0)
MCHC: 34.9 g/dL (ref 31.5–35.7)
MCV: 93 fL (ref 79–97)
Monocytes Absolute: 0.6 x10E3/uL (ref 0.1–0.9)
Monocytes: 8 %
Neutrophils Absolute: 4.6 x10E3/uL (ref 1.4–7.0)
Neutrophils: 64 %
Platelets: 373 x10E3/uL (ref 150–450)
RBC: 4.45 x10E6/uL (ref 4.14–5.80)
RDW: 12.2 % (ref 11.6–15.4)
WBC: 7.2 x10E3/uL (ref 3.4–10.8)

## 2024-03-10 LAB — VITAMIN D 25 HYDROXY (VIT D DEFICIENCY, FRACTURES): Vit D, 25-Hydroxy: 24.5 ng/mL — ABNORMAL LOW (ref 30.0–100.0)

## 2024-03-10 LAB — COMPREHENSIVE METABOLIC PANEL WITH GFR
ALT: 30 IU/L (ref 0–44)
AST: 18 IU/L (ref 0–40)
Albumin: 4.8 g/dL (ref 4.1–5.1)
Alkaline Phosphatase: 62 IU/L (ref 47–123)
BUN/Creatinine Ratio: 13 (ref 9–20)
BUN: 17 mg/dL (ref 6–24)
Bilirubin Total: 0.2 mg/dL (ref 0.0–1.2)
CO2: 21 mmol/L (ref 20–29)
Calcium: 9.4 mg/dL (ref 8.7–10.2)
Chloride: 104 mmol/L (ref 96–106)
Creatinine, Ser: 1.34 mg/dL — ABNORMAL HIGH (ref 0.76–1.27)
Globulin, Total: 1.8 g/dL (ref 1.5–4.5)
Glucose: 92 mg/dL (ref 70–99)
Potassium: 4.4 mmol/L (ref 3.5–5.2)
Sodium: 142 mmol/L (ref 134–144)
Total Protein: 6.6 g/dL (ref 6.0–8.5)
eGFR: 69 mL/min/1.73 (ref >59)

## 2024-03-10 LAB — LAMOTRIGINE LEVEL: Lamotrigine Lvl: 3.2 ug/mL (ref 2.0–20.0)

## 2024-03-10 LAB — LIPID PANEL W/O CHOL/HDL RATIO
Cholesterol, Total: 250 mg/dL — ABNORMAL HIGH (ref 100–199)
HDL: 39 mg/dL — ABNORMAL LOW (ref >39)
LDL Chol Calc (NIH): 189 mg/dL — ABNORMAL HIGH (ref 0–99)
Triglycerides: 123 mg/dL (ref 0–149)
VLDL Cholesterol Cal: 22 mg/dL (ref 5–40)

## 2024-03-10 LAB — MAGNESIUM: Magnesium: 2 mg/dL (ref 1.6–2.3)

## 2024-03-10 LAB — TSH: TSH: 0.435 u[IU]/mL — ABNORMAL LOW (ref 0.450–4.500)

## 2024-03-13 NOTE — Progress Notes (Signed)
 Scheduled

## 2024-04-12 ENCOUNTER — Other Ambulatory Visit

## 2024-04-12 DIAGNOSIS — E7800 Pure hypercholesterolemia, unspecified: Secondary | ICD-10-CM

## 2024-04-12 DIAGNOSIS — R7989 Other specified abnormal findings of blood chemistry: Secondary | ICD-10-CM

## 2024-04-13 ENCOUNTER — Ambulatory Visit: Payer: Self-pay | Admitting: Nurse Practitioner

## 2024-04-13 LAB — LIPID PANEL W/O CHOL/HDL RATIO
Cholesterol, Total: 247 mg/dL — ABNORMAL HIGH (ref 100–199)
HDL: 46 mg/dL (ref >39)
LDL Chol Calc (NIH): 186 mg/dL — ABNORMAL HIGH (ref 0–99)
Triglycerides: 85 mg/dL (ref 0–149)
VLDL Cholesterol Cal: 15 mg/dL (ref 5–40)

## 2024-04-13 LAB — T4, FREE: Free T4: 1 ng/dL (ref 0.82–1.77)

## 2024-04-13 LAB — TSH: TSH: 0.904 u[IU]/mL (ref 0.450–4.500)

## 2024-04-13 NOTE — Progress Notes (Signed)
 Contacted via MyChart The 10-year ASCVD risk score (Arnett DK, et al., 2019) is: 1.9%   Values used to calculate the score:     Age: 38 years     Clinically relevant sex: Male     Is Non-Hispanic African American: No     Diabetic: No     Tobacco smoker: No     Systolic Blood Pressure: 128 mmHg     Is BP treated: No     HDL Cholesterol: 46 mg/dL     Total Cholesterol: 247 mg/dL  Good morning Thomas Sawyer, your labs have returned. Thyroid levels normal. Your cholesterol is still high, but continued recommendations to make lifestyle changes. Your LDL is above normal.  The LDL is the bad cholesterol.  Over time and in combination with inflammation and other factors, this contributes to plaque which in turn may lead to stroke and/or heart attack down the road.  Sometimes high LDL is primarily genetic, and people might be eating all the right foods but still have high numbers.  Other times, there is room for improvement in one's diet and eating healthier can bring this number down and potentially reduce one's risk of heart attack and/or stroke. To reduce your LDL, Remember - more fruits and vegetables, more fish, and limit red meat and dairy products.  More soy, nuts, beans, barley, lentils, oats and plant sterol ester enriched margarine instead of butter.  I also encourage eliminating sugar and processed food.  Remember, shop on the outside of the grocery store and visit your [employer]. No medication needed at this time, but if LDL trends above 190 and stays there we may want to discuss this. Any questions? Keep being amazing!!  Thank you for allowing me to participate in your care.  I appreciate you. Kindest regards, Karl Knarr

## 2024-09-06 ENCOUNTER — Ambulatory Visit: Admitting: Nurse Practitioner
